# Patient Record
Sex: Female | Born: 1980 | Race: White | Hispanic: No | Marital: Married | Smoking: Former smoker
Health system: Southern US, Community
[De-identification: ages and names within clinical notes are randomized; demographics above are authoritative.]

## PROBLEM LIST (undated history)

## (undated) DIAGNOSIS — I10 Essential (primary) hypertension: Secondary | ICD-10-CM

## (undated) DIAGNOSIS — O139 Gestational [pregnancy-induced] hypertension without significant proteinuria, unspecified trimester: Secondary | ICD-10-CM

## (undated) DIAGNOSIS — H9190 Unspecified hearing loss, unspecified ear: Secondary | ICD-10-CM

## (undated) HISTORY — DX: Essential (primary) hypertension: I10

## (undated) HISTORY — DX: Gestational (pregnancy-induced) hypertension without significant proteinuria, unspecified trimester: O13.9

## (undated) HISTORY — PX: BIOPSY BREAST: PRO8

---
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
## 2000-10-28 ENCOUNTER — Encounter: Admission: RE | Admit: 2000-10-28 | Discharge: 2000-10-28 | Payer: Self-pay | Admitting: *Deleted

## 2002-08-01 ENCOUNTER — Emergency Department (HOSPITAL_COMMUNITY): Admission: EM | Admit: 2002-08-01 | Discharge: 2002-08-01 | Payer: Self-pay | Admitting: Emergency Medicine

## 2002-08-04 ENCOUNTER — Encounter: Payer: Self-pay | Admitting: Family Medicine

## 2002-08-04 ENCOUNTER — Inpatient Hospital Stay (HOSPITAL_COMMUNITY): Admission: AD | Admit: 2002-08-04 | Discharge: 2002-08-04 | Payer: Self-pay | Admitting: Family Medicine

## 2002-10-18 ENCOUNTER — Ambulatory Visit (HOSPITAL_COMMUNITY): Admission: RE | Admit: 2002-10-18 | Discharge: 2002-10-18 | Payer: Self-pay | Admitting: *Deleted

## 2002-10-31 ENCOUNTER — Inpatient Hospital Stay (HOSPITAL_COMMUNITY): Admission: AD | Admit: 2002-10-31 | Discharge: 2002-10-31 | Payer: Self-pay | Admitting: *Deleted

## 2003-01-31 ENCOUNTER — Ambulatory Visit (HOSPITAL_COMMUNITY): Admission: RE | Admit: 2003-01-31 | Discharge: 2003-01-31 | Payer: Self-pay | Admitting: *Deleted

## 2003-02-16 ENCOUNTER — Inpatient Hospital Stay (HOSPITAL_COMMUNITY): Admission: EM | Admit: 2003-02-16 | Discharge: 2003-02-17 | Payer: Self-pay | Admitting: *Deleted

## 2003-02-19 ENCOUNTER — Ambulatory Visit (HOSPITAL_COMMUNITY): Admission: RE | Admit: 2003-02-19 | Discharge: 2003-02-19 | Payer: Self-pay | Admitting: Obstetrics & Gynecology

## 2003-02-22 ENCOUNTER — Encounter: Admission: RE | Admit: 2003-02-22 | Discharge: 2003-02-22 | Payer: Self-pay | Admitting: *Deleted

## 2003-03-01 ENCOUNTER — Encounter: Admission: RE | Admit: 2003-03-01 | Discharge: 2003-03-01 | Payer: Self-pay | Admitting: *Deleted

## 2003-03-08 ENCOUNTER — Encounter: Admission: RE | Admit: 2003-03-08 | Discharge: 2003-03-08 | Payer: Self-pay | Admitting: *Deleted

## 2003-03-15 ENCOUNTER — Encounter: Admission: RE | Admit: 2003-03-15 | Discharge: 2003-03-15 | Payer: Self-pay | Admitting: *Deleted

## 2003-03-16 ENCOUNTER — Inpatient Hospital Stay (HOSPITAL_COMMUNITY): Admission: AD | Admit: 2003-03-16 | Discharge: 2003-03-20 | Payer: Self-pay | Admitting: *Deleted

## 2003-03-16 ENCOUNTER — Encounter: Admission: RE | Admit: 2003-03-16 | Discharge: 2003-03-16 | Payer: Self-pay | Admitting: *Deleted

## 2003-03-23 ENCOUNTER — Inpatient Hospital Stay (HOSPITAL_COMMUNITY): Admission: AD | Admit: 2003-03-23 | Discharge: 2003-03-23 | Payer: Self-pay | Admitting: Obstetrics and Gynecology

## 2003-03-26 ENCOUNTER — Inpatient Hospital Stay (HOSPITAL_COMMUNITY): Admission: AD | Admit: 2003-03-26 | Discharge: 2003-03-26 | Payer: Self-pay | Admitting: Obstetrics and Gynecology

## 2003-05-09 ENCOUNTER — Encounter: Admission: RE | Admit: 2003-05-09 | Discharge: 2003-05-09 | Payer: Self-pay | Admitting: General Surgery

## 2004-03-19 ENCOUNTER — Emergency Department (HOSPITAL_COMMUNITY): Admission: EM | Admit: 2004-03-19 | Discharge: 2004-03-19 | Payer: Self-pay | Admitting: Emergency Medicine

## 2004-07-15 ENCOUNTER — Emergency Department (HOSPITAL_COMMUNITY): Admission: EM | Admit: 2004-07-15 | Discharge: 2004-07-15 | Payer: Self-pay | Admitting: Family Medicine

## 2004-11-10 ENCOUNTER — Encounter: Admission: RE | Admit: 2004-11-10 | Discharge: 2004-11-10 | Payer: Self-pay | Admitting: Obstetrics and Gynecology

## 2004-12-17 ENCOUNTER — Ambulatory Visit (HOSPITAL_BASED_OUTPATIENT_CLINIC_OR_DEPARTMENT_OTHER): Admission: RE | Admit: 2004-12-17 | Discharge: 2004-12-17 | Payer: Self-pay | Admitting: General Surgery

## 2004-12-17 ENCOUNTER — Encounter (INDEPENDENT_AMBULATORY_CARE_PROVIDER_SITE_OTHER): Payer: Self-pay | Admitting: Specialist

## 2004-12-17 ENCOUNTER — Ambulatory Visit (HOSPITAL_COMMUNITY): Admission: RE | Admit: 2004-12-17 | Discharge: 2004-12-17 | Payer: Self-pay | Admitting: General Surgery

## 2005-03-31 ENCOUNTER — Encounter: Admission: RE | Admit: 2005-03-31 | Discharge: 2005-04-22 | Payer: Self-pay | Admitting: Sports Medicine

## 2006-09-14 ENCOUNTER — Encounter: Admission: RE | Admit: 2006-09-14 | Discharge: 2006-11-16 | Payer: Self-pay | Admitting: Orthopaedic Surgery

## 2006-12-07 ENCOUNTER — Encounter: Admission: RE | Admit: 2006-12-07 | Discharge: 2006-12-28 | Payer: Self-pay | Admitting: Orthopedic Surgery

## 2007-07-18 ENCOUNTER — Emergency Department (HOSPITAL_COMMUNITY): Admission: EM | Admit: 2007-07-18 | Discharge: 2007-07-18 | Payer: Self-pay | Admitting: Family Medicine

## 2008-09-05 ENCOUNTER — Emergency Department (HOSPITAL_COMMUNITY): Admission: EM | Admit: 2008-09-05 | Discharge: 2008-09-05 | Payer: Self-pay | Admitting: Emergency Medicine

## 2008-10-09 ENCOUNTER — Emergency Department (HOSPITAL_COMMUNITY): Admission: EM | Admit: 2008-10-09 | Discharge: 2008-10-09 | Payer: Self-pay | Admitting: Family Medicine

## 2009-02-24 ENCOUNTER — Emergency Department (HOSPITAL_COMMUNITY): Admission: EM | Admit: 2009-02-24 | Discharge: 2009-02-24 | Payer: Self-pay | Admitting: Family Medicine

## 2009-04-07 ENCOUNTER — Emergency Department (HOSPITAL_COMMUNITY): Admission: EM | Admit: 2009-04-07 | Discharge: 2009-04-07 | Payer: Self-pay | Admitting: Family Medicine

## 2009-10-12 ENCOUNTER — Emergency Department (HOSPITAL_COMMUNITY): Admission: EM | Admit: 2009-10-12 | Discharge: 2009-10-12 | Payer: Self-pay | Admitting: Emergency Medicine

## 2009-12-11 ENCOUNTER — Emergency Department (HOSPITAL_COMMUNITY): Admission: EM | Admit: 2009-12-11 | Discharge: 2009-12-11 | Payer: Self-pay | Admitting: Family Medicine

## 2010-07-16 ENCOUNTER — Ambulatory Visit (HOSPITAL_COMMUNITY)
Admission: RE | Admit: 2010-07-16 | Discharge: 2010-07-16 | Payer: Self-pay | Source: Home / Self Care | Attending: Psychology | Admitting: Psychology

## 2010-07-30 ENCOUNTER — Ambulatory Visit (HOSPITAL_COMMUNITY): Admit: 2010-07-30 | Payer: Self-pay | Admitting: Psychology

## 2010-11-21 NOTE — Op Note (Signed)
NAMESACHI, BOULAY               ACCOUNT NO.:  [id number]   MEDICAL RECORD NO.:  [id number]          PATIENT TYPE:  AMB   LOCATION:  DSC                          FACILITY:  [facility]   PHYSICIAN:  Rose Phi. Maple Hudson, M.D.   DATE OF BIRTH:  [birth year]   DATE OF PROCEDURE:  12/17/2004  DATE OF DISCHARGE:                                 OPERATIVE REPORT   PREOPERATIVE DIAGNOSIS:  Fibroadenoma of the right breast.   POSTOPERATIVE DIAGNOSIS:  Fibroadenoma of the right breast.   OPERATIONS:  Excision of right breast mass.   SURGEON:  Dr. Francina Ames.   ANESTHESIA:  MAC.   OPERATIVE PROCEDURE:  The patient was placed on the operating table with the  arms extended and the right breast prepped and draped in the usual fashion.  The palpable nodules at about the 5:30 position and subareolar. A short  circumareolar incision was then outlined and their area infiltrated with the  local anesthetic mixture. Incision was made and the fibroadenoma exposed. I  grasped it and elevated it and then excised it. Two small bleeders were  coagulated.   Skin closure was subcuticular 4-0 Monocryl and Steri-Strips.   Dressing was applied and the patient transferred to the recovery room in  satisfactory condition having tolerated the procedure well.       PRY/MEDQ  D:  12/17/2004  T:  12/17/2004  Job:  [id number]

## 2012-09-21 ENCOUNTER — Encounter (HOSPITAL_COMMUNITY): Payer: Self-pay

## 2012-09-21 ENCOUNTER — Inpatient Hospital Stay (HOSPITAL_COMMUNITY): Payer: Medicaid Other

## 2012-09-21 ENCOUNTER — Inpatient Hospital Stay (HOSPITAL_COMMUNITY)
Admission: AD | Admit: 2012-09-21 | Discharge: 2012-09-21 | Disposition: A | Discharge status: Home / Self Care | Payer: Medicaid Other | Source: Ambulatory Visit | Attending: Family Medicine | Admitting: Family Medicine

## 2012-09-21 DIAGNOSIS — O039 Complete or unspecified spontaneous abortion without complication: Secondary | ICD-10-CM | POA: Insufficient documentation

## 2012-09-21 DIAGNOSIS — O200 Threatened abortion: Secondary | ICD-10-CM

## 2012-09-21 DIAGNOSIS — R109 Unspecified abdominal pain: Secondary | ICD-10-CM | POA: Insufficient documentation

## 2012-09-21 HISTORY — DX: Unspecified hearing loss, unspecified ear: H91.90

## 2012-09-21 LAB — URINALYSIS, ROUTINE W REFLEX MICROSCOPIC
Leukocytes, UA: NEGATIVE
Nitrite: NEGATIVE
Protein, ur: NEGATIVE mg/dL
Specific Gravity, Urine: >1.03 — ABNORMAL HIGH (ref 1.005–1.030)
Urobilinogen, UA: 0.2 mg/dL (ref 0.0–1.0)

## 2012-09-21 LAB — WET PREP, GENITAL: Trich, Wet Prep: NONE SEEN

## 2012-09-21 LAB — CBC
MCH: 29.8 pg (ref 26.0–34.0)
MCHC: 33.4 g/dL (ref 30.0–36.0)
Platelets: 199 10*3/uL (ref 150–400)
RDW: 13.6 % (ref 11.5–15.5)

## 2012-09-21 LAB — ABO/RH: ABO/RH(D): O POS

## 2012-09-21 LAB — URINE MICROSCOPIC-ADD ON

## 2012-09-21 LAB — HCG, QUANTITATIVE, PREGNANCY: hCG, Beta Chain, Quant, S: 2465 m[IU]/mL — ABNORMAL HIGH (ref <5)

## 2012-09-21 LAB — POCT PREGNANCY, URINE: Preg Test, Ur: POSITIVE — AB

## 2012-09-21 NOTE — MAU Provider Note (Signed)
Chart reviewed and agree with management and plan.  

## 2012-09-21 NOTE — MAU Note (Signed)
Patient states she started bleeding this am with a gush of blood. Missed last months period. Having abdominal pain.

## 2012-09-21 NOTE — MAU Provider Note (Signed)
History     CSN: [id number]  Arrival date and time: 09/21/12 5784   First Provider Initiated Contact with Patient 09/21/12 [phone]      Chief Complaint  Patient presents with  . Vaginal Bleeding  . Abdominal Pain   HPI Alicia Ortiz 32 y.o. Comes to MAU with heavy vaginal bleeding and abdominal cramping.  Last period was in January.  No period in February.  Began having vaginal bleeding on Monday and thought it was her period.  Yesterday had cramping which kept her from sleeping well last night.  Earlier this morning she had a large amount of bleeding with clots so came for evaluation.  Urine pregnancy test is positive.  Uses no contraception.  OB History   Grav Para Term Preterm Abortions TAB SAB Ect Mult Living   1 1        1       Past Medical History  Diagnosis Date  . Deaf     earing aids, reads lips    Past Surgical History  Procedure Laterality Date  . Biopsy breast      right breast    Family History  Problem Relation Age of Onset  . Stroke Mother     History  Substance Use Topics  . Smoking status: Light Tobacco Smoker  . Smokeless tobacco: Not on file  . Alcohol Use: No    Allergies:  Allergies  Allergen Reactions  . Sulfa Antibiotics Hives    Prescriptions prior to admission  Medication Sig Dispense Refill  . calcium carbonate-magnesium hydroxide (ROLAIDS) 334 MG CHEW Chew 2 tablets by mouth once.        ROS Physical Exam   Blood pressure 120/80, pulse 102, temperature 98.5 F (36.9 C), temperature source Oral, resp. rate 16, last menstrual period 09/21/2012.  Physical Exam  Constitutional: She is oriented to person, place, and time. She appears well-developed and well-nourished. No distress.  Cardiovascular: Normal rate and regular rhythm.   Respiratory: Effort normal and breath sounds normal. No respiratory distress.  GI: Soft. She exhibits no distension. There is no tenderness.  Genitourinary: Cervix exhibits no motion tenderness.  There is bleeding around the vagina. Vaginal discharge found.  Cervix is visually 1cm dilated with os filled by large blood clot  Neurological: She is alert and oriented to person, place, and time.  Skin: Skin is warm and dry.  Psychiatric: She has a normal mood and affect. Her behavior is normal.   Results for orders placed during the hospital encounter of 09/21/12 (from the past 24 hour(s))  URINALYSIS, ROUTINE W REFLEX MICROSCOPIC     Status: Abnormal   Collection Time    09/21/12  8:05 AM      Result Value Range   Color, Urine YELLOW  YELLOW   APPearance CLOUDY (*) CLEAR   Specific Gravity, Urine >1.030 (*) 1.005 - 1.030   pH 6.0  5.0 - 8.0   Glucose, UA NEGATIVE  NEGATIVE mg/dL   Hgb urine dipstick LARGE (*) NEGATIVE   Bilirubin Urine NEGATIVE  NEGATIVE   Ketones, ur NEGATIVE  NEGATIVE mg/dL   Protein, ur NEGATIVE  NEGATIVE mg/dL   Urobilinogen, UA 0.2  0.0 - 1.0 mg/dL   Nitrite NEGATIVE  NEGATIVE   Leukocytes, UA NEGATIVE  NEGATIVE  URINE MICROSCOPIC-ADD ON     Status: Abnormal   Collection Time    09/21/12  8:05 AM      Result Value Range   Squamous Epithelial / LPF FEW (*)  RARE   RBC / HPF TOO NUMEROUS TO COUNT  <3 RBC/hpf   Bacteria, UA RARE  RARE  POCT PREGNANCY, URINE     Status: Abnormal   Collection Time    09/21/12  8:36 AM      Result Value Range   Preg Test, Ur POSITIVE (*) NEGATIVE  HCG, QUANTITATIVE, PREGNANCY     Status: Abnormal   Collection Time    09/21/12  8:55 AM      Result Value Range   hCG, Beta Chain, Quant, S 2465 (*) <5 mIU/mL  CBC     Status: Abnormal   Collection Time    09/21/12  8:55 AM      Result Value Range   WBC 17.4 (*) 4.0 - 10.5 K/uL   RBC 3.93  3.87 - 5.11 MIL/uL   Hemoglobin 11.7 (*) 12.0 - 15.0 g/dL   HCT 16.1 (*) 09.6 - 04.5 %   MCV 89.1  78.0 - 100.0 fL   MCH 29.8  26.0 - 34.0 pg   MCHC 33.4  30.0 - 36.0 g/dL   RDW 40.9  81.1 - 91.4 %   Platelets 199  150 - 400 K/uL  ABO/RH     Status: None   Collection Time     09/21/12  8:55 AM      Result Value Range   ABO/RH(D) O POS    WET PREP, GENITAL     Status: Abnormal   Collection Time    09/21/12  9:29 AM      Result Value Range   Yeast Wet Prep HPF POC NONE SEEN  NONE SEEN   Trich, Wet Prep NONE SEEN  NONE SEEN   Clue Cells Wet Prep HPF POC NONE SEEN  NONE SEEN   WBC, Wet Prep HPF POC FEW (*) NONE SEEN     MAU Course  Procedures CBC, quant HCG, type and Rh done, ultrasound, sterile spec exam with cultures for gc/ct, wet prep. Cervix visually dilated and large clot seen at os.    Assessment and Plan  1. G2P1001 at 8.2 weeks with heavy vaginal bleeding, GA by u/s 4.4 weeks 2. SAB vs early pregnancy bleeding  -Return to MAU Friday morning for repeat quant HCG -Tylenol for pain -Instructed to return for severe pain or increased bleeding I have seen this patient with the student Nurse Midwife, Leandro Reasoner.  I agree with the exam and documentation. Nolene Bernheim, RN, FNP  Nolene Bernheim 09/21/2012, 8:44 AM

## 2012-09-22 LAB — GC/CHLAMYDIA PROBE AMP: CT Probe RNA: NEGATIVE

## 2012-09-23 ENCOUNTER — Inpatient Hospital Stay (HOSPITAL_COMMUNITY)
Admission: AD | Admit: 2012-09-23 | Discharge: 2012-09-23 | Disposition: A | Discharge status: Home / Self Care | Payer: Medicaid Other | Source: Ambulatory Visit | Attending: Obstetrics & Gynecology | Admitting: Obstetrics & Gynecology

## 2012-09-23 DIAGNOSIS — O039 Complete or unspecified spontaneous abortion without complication: Secondary | ICD-10-CM | POA: Insufficient documentation

## 2012-09-23 LAB — HCG, QUANTITATIVE, PREGNANCY: hCG, Beta Chain, Quant, S: 527 m[IU]/mL — ABNORMAL HIGH (ref <5)

## 2012-09-23 NOTE — MAU Note (Signed)
Patient to MAU for repeat BHCG. Patient states she continues to have bleeding like a period but no pain.

## 2012-09-23 NOTE — MAU Provider Note (Signed)
CC: Follow-up      HPI Alicia Ortiz is a 32 y.o. G2P1 [redacted]w[redacted]d by LMP presenting for a followup quantitative beta-hCG. She was initially seen 09/21/2012 after having heavy vaginal bleeding and cramping with passage of clots. Quantitative beta hCG was 2465. Her bleeding has tapered to light and she is not having abdominal pain now.   Past Medical History  Diagnosis Date  . Deaf     earing aids, reads lips    OB History   Grav Para Term Preterm Abortions TAB SAB Ect Mult Living   2 1        1      # Outc Date GA Lbr Len/2nd Wgt Sex Del Anes PTL Lv   1 CUR            2 PAR               Past Surgical History  Procedure Laterality Date  . Biopsy breast      right breast    History   Social History  . Marital Status: Single    Spouse Name: N/A    Number of Children: N/A  . Years of Education: N/A   Occupational History  . Not on file.   Social History Main Topics  . Smoking status: Light Tobacco Smoker  . Smokeless tobacco: Not on file  . Alcohol Use: No  . Drug Use: No  . Sexually Active: Yes   Other Topics Concern  . Not on file   Social History Narrative  . No narrative on file    No current facility-administered medications on file prior to encounter.   Current Outpatient Prescriptions on File Prior to Encounter  Medication Sig Dispense Refill  . calcium carbonate-magnesium hydroxide (ROLAIDS) 334 MG CHEW Chew 2 tablets by mouth once.        Allergies  Allergen Reactions  . Sulfa Antibiotics Hives    ROS Pertinent items in HPI  PHYSICAL EXAM Filed Vitals:   09/23/12 0914  BP: 133/93  Pulse: 99  Temp: 98 F (36.7 C)  Resp: 16   General: Well nourished, well developed female in no acute distress Cardiovascular: Normal rate Respiratory: Normal effort Abdomen: Soft, nontender Back: No CVAT Extremities: No edema Neurologic: Alert and oriented   LAB RESULTS Results for orders placed during the hospital encounter of 09/23/12 (from the  past 24 hour(s))  HCG, QUANTITATIVE, PREGNANCY     Status: Abnormal   Collection Time    09/23/12  8:48 AM      Result Value Range   hCG, Beta Chain, Quant, S 527 (*) <5 mIU/mL    IMAGING 09/21/12 [place]: question IUGS 5 wk size, small left CLC, otherwise normal    ASSESSMENT  1. Complete miscarriage   Most likely has completed SAB  PLAN Discharge home with ectopic precautionsSee AVS for patient education. Follow up quantitative beta-hCG in one week Follow-up Information   Follow up with [facility] In 1 week. (someone from the clinic will call you with an appointment for your blood tests in one week and for Pap smear appointment)        Medication List    TAKE these medications       calcium carbonate-magnesium hydroxide 334 MG Chew  Commonly known as:  ROLAIDS  Chew 2 tablets by mouth once.          Danae Orleans, CNM 09/23/2012 10:56 AM

## 2012-09-30 ENCOUNTER — Other Ambulatory Visit: Payer: Medicaid Other

## 2012-09-30 DIAGNOSIS — O039 Complete or unspecified spontaneous abortion without complication: Secondary | ICD-10-CM

## 2012-09-30 LAB — HCG, QUANTITATIVE, PREGNANCY: hCG, Beta Chain, Quant, S: 38.6 m[IU]/mL

## 2012-10-12 ENCOUNTER — Encounter: Payer: Self-pay | Admitting: Advanced Practice Midwife

## 2012-10-12 ENCOUNTER — Other Ambulatory Visit (HOSPITAL_COMMUNITY)
Admission: RE | Admit: 2012-10-12 | Discharge: 2012-10-12 | Disposition: A | Discharge status: Home / Self Care | Payer: Medicaid Other | Source: Ambulatory Visit | Attending: Obstetrics & Gynecology | Admitting: Obstetrics & Gynecology

## 2012-10-12 ENCOUNTER — Ambulatory Visit (INDEPENDENT_AMBULATORY_CARE_PROVIDER_SITE_OTHER): Payer: Medicaid Other | Admitting: Advanced Practice Midwife

## 2012-10-12 VITALS — BP 136/90 | HR 91 | Temp 97.4°F | Ht 62.25 in | Wt 146.3 lb

## 2012-10-12 DIAGNOSIS — O039 Complete or unspecified spontaneous abortion without complication: Secondary | ICD-10-CM

## 2012-10-12 DIAGNOSIS — Z1151 Encounter for screening for human papillomavirus (HPV): Secondary | ICD-10-CM | POA: Insufficient documentation

## 2012-10-12 DIAGNOSIS — Z01419 Encounter for gynecological examination (general) (routine) without abnormal findings: Secondary | ICD-10-CM | POA: Insufficient documentation

## 2012-10-12 DIAGNOSIS — Z124 Encounter for screening for malignant neoplasm of cervix: Secondary | ICD-10-CM

## 2012-10-12 LAB — POCT PREGNANCY, URINE: Preg Test, Ur: NEGATIVE

## 2012-10-12 NOTE — Progress Notes (Signed)
Subjective:     Alicia Ortiz is a 32 y.o. female here for pap and f/u from MAU after SAB.  Current complaints: none.  Quant was 38 two weeks ago.  Gynecologic History Patient's last menstrual period was 07/25/2012. Contraception: none Last Pap: unsure. Results were: normal   Obstetric History OB History   Grav Para Term Preterm Abortions TAB SAB Ect Mult Living   2 1        1      # Outc Date GA Lbr Len/2nd Wgt Sex Del Anes PTL Lv   1 GRA            Comments: System Generated. Please review and update pregnancy details.   2 PAR                The following portions of the patient's history were reviewed and updated as appropriate: allergies, current medications, past family history, past medical history, past social history, past surgical history and problem list.  Review of Systems A comprehensive review of systems was negative.    Objective:    BP 136/90  Pulse 91  Temp(Src) 97.4 F (36.3 C) (Oral)  Ht 5' 2.25" (1.581 m)  Wt 146 lb 4.8 oz (66.361 kg)  BMI 26.55 kg/m2  LMP 07/25/2012  Breastfeeding? Unknown  General Appearance:    Alert, cooperative, no distress, appears stated age  Head:    Normocephalic, without obvious abnormality, atraumatic  Neck:   Supple, symmetrical, trachea midline, no adenopathy;    thyroid:  no enlargement/tenderness/nodules; no carotid   bruit or JVD  Back:     Symmetric, no curvature, ROM normal, no CVA tenderness  Lungs:     Normal rate, no distress  Chest Wall:    No tenderness or deformity   Heart:    Regular rate   Breast Exam:    No tenderness, masses, or nipple abnormality  Abdomen:     Soft, non-tender, bowel sounds active all four quadrants,    no masses, no organomegaly  Genitalia:    Normal female without lesion, discharge or tenderness     Extremities:   Extremities normal, atraumatic, no cyanosis or edema  Pulses:   2+ and symmetric all extremities  Skin:   Skin color, texture, turgor normal, no rashes or lesions   Lymph nodes:   Cervical, supraclavicular, and axillary nodes normal  Neurologic:   CNII-XII intact, normal strength, sensation and reflexes    throughout      Assessment:    Healthy female exam.   UPT negative today - complete SAB Plan:    F/U annually or PRN Pap with HPV done today

## 2013-02-22 ENCOUNTER — Encounter: Payer: Self-pay | Admitting: Obstetrics & Gynecology

## 2013-02-22 ENCOUNTER — Ambulatory Visit: Payer: Medicaid Other

## 2013-03-28 ENCOUNTER — Encounter: Payer: Self-pay | Admitting: Obstetrics & Gynecology

## 2013-03-28 ENCOUNTER — Ambulatory Visit (INDEPENDENT_AMBULATORY_CARE_PROVIDER_SITE_OTHER): Payer: Medicaid Other | Admitting: Obstetrics & Gynecology

## 2013-03-28 VITALS — BP 119/79 | Temp 97.7°F | Wt 153.0 lb

## 2013-03-28 DIAGNOSIS — H9190 Unspecified hearing loss, unspecified ear: Secondary | ICD-10-CM | POA: Insufficient documentation

## 2013-03-28 DIAGNOSIS — Z3480 Encounter for supervision of other normal pregnancy, unspecified trimester: Secondary | ICD-10-CM

## 2013-03-28 DIAGNOSIS — Z3491 Encounter for supervision of normal pregnancy, unspecified, first trimester: Secondary | ICD-10-CM

## 2013-03-28 DIAGNOSIS — O0993 Supervision of high risk pregnancy, unspecified, third trimester: Secondary | ICD-10-CM | POA: Insufficient documentation

## 2013-03-28 LAB — POCT URINALYSIS DIP (DEVICE)
Glucose, UA: NEGATIVE mg/dL
Hgb urine dipstick: NEGATIVE
Leukocytes, UA: NEGATIVE
Nitrite: NEGATIVE
Urobilinogen, UA: 1 mg/dL (ref 0.0–1.0)
pH: 7 (ref 5.0–8.0)

## 2013-03-28 LAB — OB RESULTS CONSOLE HGB/HCT, BLOOD
HEMATOCRIT: 35 %
HEMOGLOBIN: 12 g/dL

## 2013-03-28 LAB — OB RESULTS CONSOLE RPR: RPR: NONREACTIVE

## 2013-03-28 LAB — OB RESULTS CONSOLE PLATELET COUNT: Platelets: 203 10*3/uL

## 2013-03-28 MED ORDER — PRENATAL MULTIVITAMIN CH: 1.0000 | ORAL_TABLET | Freq: Every day | ORAL | Status: DC

## 2013-03-28 NOTE — Progress Notes (Signed)
Pulse- 91 Patient reports lower abdominal/pelvic pain

## 2013-03-28 NOTE — Progress Notes (Signed)
   Subjective:    Alicia Ortiz is a [id number] [redacted]w[redacted]d being seen today for her first obstetrical visit.  Her obstetrical history is significant for miscarriage earlier this year. Patient does intend to breast feed. Pregnancy history fully reviewed.  Patient reports no complaints.  Filed Vitals:   03/28/13 0914  BP: 119/79  Temp: 97.7 F (36.5 C)  Weight: 153 lb (69.4 kg)    HISTORY: OB History  Gravida Para Term Preterm AB SAB TAB Ectopic Multiple Living  3 1 1  0 1 1 0 0 0 1    # Outcome Date GA Lbr Len/2nd Weight Sex Delivery Anes PTL Lv  3 CUR           2 SAB 09/2012          1 TRM 03/18/03 [redacted]w[redacted]d  7 lb 9 oz (3.43 kg) F SVD None  Y     Comments: high BP in pregnancy, was put on bedrest      Past Medical History  Diagnosis Date  . Deaf     earing aids, reads lips   Past Surgical History  Procedure Laterality Date  . Biopsy breast      right breast   Family History  Problem Relation Age of Onset  . Stroke Mother   . Hypertension Mother   . Hyperlipidemia Mother   . Cancer Daughter      Exam    Uterus:     Pelvic Exam:    Perineum: No Hemorrhoids   Vulva: normal   Vagina:  normal mucosa, normal discharge   pH: n/a   Cervix: small mobile endocervical polyp, not bleeding   Adnexa: normal adnexa   Bony Pelvis: average  System: Breast:  normal appearance, no masses or tenderness   Skin: normal coloration and turgor, no rashes    Neurologic: oriented, normal mood   Extremities: no erythema, induration, or nodules   HEENT sclera clear, anicteric, oropharynx clear, no lesions, neck supple with midline trachea and thyroid without masses   Mouth/Teeth mucous membranes moist, pharynx normal without lesions and dental hygiene good   Neck supple and no masses   Cardiovascular: regular rate and rhythm   Respiratory:  appears well, vitals normal, no respiratory distress, acyanotic, normal RR, chest clear, no wheezing, crepitations, rhonchi, normal symmetric air  entry   Abdomen: soft, non-tender; bowel sounds normal; no masses,  no organomegaly   Urinary: urethral meatus normal      Assessment:    Pregnancy: [id number] Patient Active Problem List   Diagnosis Date Noted  . Deaf 03/28/2013        Plan:     Initial labs drawn. Prenatal vitamins. Problem list reviewed and updated. Genetic Screening discussed First Screen: ordered.  Ultrasound discussed; fetal survey: ordered.--needs dating since unsure of LMP.  Had spotting in July, but last nml menses was June.  Had miscarriage in March 2014.  Follow up in 4 weeks. Prenatal vitamins. Pap earlier this year is WNL--negative HPV.  Nakeshia Waldeck H. 03/28/2013

## 2013-03-28 NOTE — Patient Instructions (Signed)
Pregnancy - First Trimester  During sexual intercourse, millions of sperm go into the vagina. Only 1 sperm will penetrate and fertilize the female egg while it is in the Fallopian tube. One week later, the fertilized egg implants into the wall of the uterus. An embryo begins to develop into a baby. At 6 to 8 weeks, the eyes and face are formed and the heartbeat can be seen on ultrasound. At the end of 12 weeks (first trimester), all the baby's organs are formed. Now that you are pregnant, you will want to do everything you can to have a healthy baby. Two of the most important things are to get good prenatal care and follow your caregiver's instructions. Prenatal care is all the medical care you receive before the baby's birth. It is given to prevent, find, and treat problems during the pregnancy and childbirth.  PRENATAL EXAMS  · During prenatal visits, your weight, blood pressure, and urine are checked. This is done to make sure you are healthy and progressing normally during the pregnancy.  · A pregnant woman should gain 25 to 35 pounds during the pregnancy. However, if you are overweight or underweight, your caregiver will advise you regarding your weight.  · Your caregiver will ask and answer questions for you.  · Blood work, cervical cultures, other necessary tests, and a Pap test are done during your prenatal exams. These tests are done to check on your health and the probable health of your baby. Tests are strongly recommended and done for HIV with your permission. This is the virus that causes AIDS. These tests are done because medicines can be given to help prevent your baby from being born with this infection should you have been infected without knowing it. Blood work is also used to find out your blood type, previous infections, and follow your blood levels (hemoglobin).  · Low hemoglobin (anemia) is common during pregnancy. Iron and vitamins are given to help prevent this. Later in the pregnancy, blood  tests for diabetes will be done along with any other tests if any problems develop.  · You may need other tests to make sure you and the baby are doing well.  CHANGES DURING THE FIRST TRIMESTER   Your body goes through many changes during pregnancy. They vary from person to person. Talk to your caregiver about changes you notice and are concerned about. Changes can include:  · Your menstrual period stops.  · The egg and sperm carry the genes that determine what you look like. Genes from you and your partner are forming a baby. The female genes determine whether the baby is a boy or a girl.  · Your body increases in girth and you may feel bloated.  · Feeling sick to your stomach (nauseous) and throwing up (vomiting). If the vomiting is uncontrollable, call your caregiver.  · Your breasts will begin to enlarge and become tender.  · Your nipples may stick out more and become darker.  · The need to urinate more. Painful urination may mean you have a bladder infection.  · Tiring easily.  · Loss of appetite.  · Cravings for certain kinds of food.  · At first, you may gain or lose a couple of pounds.  · You may have changes in your emotions from day to day (excited to be pregnant or concerned something may go wrong with the pregnancy and baby).  · You may have more vivid and strange dreams.  HOME CARE INSTRUCTIONS   ·   It is very important to avoid all smoking, alcohol and non-prescribed drugs during your pregnancy. These affect the formation and growth of the baby. Avoid chemicals while pregnant to ensure the delivery of a healthy infant.  · Start your prenatal visits by the 12th week of pregnancy. They are usually scheduled monthly at first, then more often in the last 2 months before delivery. Keep your caregiver's appointments. Follow your caregiver's instructions regarding medicine use, blood and lab tests, exercise, and diet.  · During pregnancy, you are providing food for you and your baby. Eat regular, well-balanced  meals. Choose foods such as meat, fish, milk and other low fat dairy products, vegetables, fruits, and whole-grain breads and cereals. Your caregiver will tell you of the ideal weight gain.  · You can help morning sickness by keeping soda crackers at the bedside. Eat a couple before arising in the morning. You may want to use the crackers without salt on them.  · Eating 4 to 5 small meals rather than 3 large meals a day also may help the nausea and vomiting.  · Drinking liquids between meals instead of during meals also seems to help nausea and vomiting.  · A physical sexual relationship may be continued throughout pregnancy if there are no other problems. Problems may be early (premature) leaking of amniotic fluid from the membranes, vaginal bleeding, or belly (abdominal) pain.  · Exercise regularly if there are no restrictions. Check with your caregiver or physical therapist if you are unsure of the safety of some of your exercises. Greater weight gain will occur in the last 2 trimesters of pregnancy. Exercising will help:  · Control your weight.  · Keep you in shape.  · Prepare you for labor and delivery.  · Help you lose your pregnancy weight after you deliver your baby.  · Wear a good support or jogging bra for breast tenderness during pregnancy. This may help if worn during sleep too.  · Ask when prenatal classes are available. Begin classes when they are offered.  · Do not use hot tubs, steam rooms, or saunas.  · Wear your seat belt when driving. This protects you and your baby if you are in an accident.  · Avoid raw meat, uncooked cheese, cat litter boxes, and soil used by cats throughout the pregnancy. These carry germs that can cause birth defects in the baby.  · The first trimester is a good time to visit your dentist for your dental health. Getting your teeth cleaned is okay. Use a softer toothbrush and brush gently during pregnancy.  · Ask for help if you have financial, counseling, or nutritional needs  during pregnancy. Your caregiver will be able to offer counseling for these needs as well as refer you for other special needs.  · Do not take any medicines or herbs unless told by your caregiver.  · Inform your caregiver if there is any mental or physical domestic violence.  · Make a list of emergency phone numbers of family, friends, hospital, and police and fire departments.  · Write down your questions. Take them to your prenatal visit.  · Do not douche.  · Do not cross your legs.  · If you have to stand for long periods of time, rotate you feet or take small steps in a circle.  · You may have more vaginal secretions that may require a sanitary pad. Do not use tampons or scented sanitary pads.  MEDICINES AND DRUG USE IN PREGNANCY  ·   Take prenatal vitamins as directed. The vitamin should contain 1 milligram of folic acid. Keep all vitamins out of reach of children. Only a couple vitamins or tablets containing iron may be fatal to a baby or young child when ingested.  · Avoid use of all medicines, including herbs, over-the-counter medicines, not prescribed or suggested by your caregiver. Only take over-the-counter or prescription medicines for pain, discomfort, or fever as directed by your caregiver. Do not use aspirin, ibuprofen, or naproxen unless directed by your caregiver.  · Let your caregiver also know about herbs you may be using.  · Alcohol is related to a number of birth defects. This includes fetal alcohol syndrome. All alcohol, in any form, should be avoided completely. Smoking will cause low birth rate and premature babies.  · Street or illegal drugs are very harmful to the baby. They are absolutely forbidden. A baby born to an addicted mother will be addicted at birth. The baby will go through the same withdrawal an adult does.  · Let your caregiver know about any medicines that you have to take and for what reason you take them.  SEEK MEDICAL CARE IF:   You have any concerns or worries during your  pregnancy. It is better to call with your questions if you feel they cannot wait, rather than worry about them.  SEEK IMMEDIATE MEDICAL CARE IF:   · An unexplained oral temperature above 102° F (38.9° C) develops, or as your caregiver suggests.  · You have leaking of fluid from the vagina (birth canal). If leaking membranes are suspected, take your temperature and inform your caregiver of this when you call.  · There is vaginal spotting or bleeding. Notify your caregiver of the amount and how many pads are used.  · You develop a bad smelling vaginal discharge with a change in the color.  · You continue to feel sick to your stomach (nauseated) and have no relief from remedies suggested. You vomit blood or coffee ground-like materials.  · You lose more than 2 pounds of weight in 1 week.  · You gain more than 2 pounds of weight in 1 week and you notice swelling of your face, hands, feet, or legs.  · You gain 5 pounds or more in 1 week (even if you do not have swelling of your hands, face, legs, or feet).  · You get exposed to German measles and have never had them.  · You are exposed to fifth disease or chickenpox.  · You develop belly (abdominal) pain. Round ligament discomfort is a common non-cancerous (benign) cause of abdominal pain in pregnancy. Your caregiver still must evaluate this.  · You develop headache, fever, diarrhea, pain with urination, or shortness of breath.  · You fall or are in a car accident or have any kind of trauma.  · There is mental or physical violence in your home.  Document Released: 06/16/2001 Document Revised: 03/16/2012 Document Reviewed: 12/18/2008  [employer]® Patient Information ©2014 [employer].

## 2013-03-29 LAB — OBSTETRIC PANEL
Antibody Screen: NEGATIVE
Basophils Absolute: 0 10*3/uL (ref 0.0–0.1)
Basophils Relative: 0 % (ref 0–1)
Eosinophils Absolute: 0.1 10*3/uL (ref 0.0–0.7)
Hemoglobin: 12 g/dL (ref 12.0–15.0)
Hepatitis B Surface Ag: NEGATIVE
MCH: 29.1 pg (ref 26.0–34.0)
MCHC: 34.2 g/dL (ref 30.0–36.0)
Monocytes Relative: 7 % (ref 3–12)
Neutro Abs: 8.2 10*3/uL — ABNORMAL HIGH (ref 1.7–7.7)
Neutrophils Relative %: 75 % (ref 43–77)
RDW: 17 % — ABNORMAL HIGH (ref 11.5–15.5)
Rh Type: POSITIVE

## 2013-03-29 LAB — CULTURE, OB URINE
Colony Count: NO GROWTH
Organism ID, Bacteria: NO GROWTH

## 2013-03-29 LAB — GC/CHLAMYDIA PROBE AMP: CT Probe RNA: NEGATIVE

## 2013-04-04 ENCOUNTER — Ambulatory Visit (HOSPITAL_COMMUNITY): Admission: RE | Admit: 2013-04-04 | Payer: Medicaid Other | Source: Ambulatory Visit

## 2013-04-04 ENCOUNTER — Other Ambulatory Visit: Payer: Self-pay | Admitting: Obstetrics & Gynecology

## 2013-04-04 ENCOUNTER — Ambulatory Visit (HOSPITAL_COMMUNITY)
Admission: RE | Admit: 2013-04-04 | Discharge: 2013-04-04 | Disposition: A | Discharge status: Home / Self Care | Payer: Medicaid Other | Source: Ambulatory Visit | Attending: Obstetrics & Gynecology | Admitting: Obstetrics & Gynecology

## 2013-04-04 DIAGNOSIS — Z363 Encounter for antenatal screening for malformations: Secondary | ICD-10-CM | POA: Insufficient documentation

## 2013-04-04 DIAGNOSIS — Z3687 Encounter for antenatal screening for uncertain dates: Secondary | ICD-10-CM

## 2013-04-04 DIAGNOSIS — O358XX0 Maternal care for other (suspected) fetal abnormality and damage, not applicable or unspecified: Secondary | ICD-10-CM | POA: Insufficient documentation

## 2013-04-04 DIAGNOSIS — Z1389 Encounter for screening for other disorder: Secondary | ICD-10-CM | POA: Insufficient documentation

## 2013-04-25 ENCOUNTER — Encounter: Payer: Self-pay | Admitting: Obstetrics & Gynecology

## 2013-04-25 ENCOUNTER — Ambulatory Visit (INDEPENDENT_AMBULATORY_CARE_PROVIDER_SITE_OTHER): Payer: Medicaid Other | Admitting: Obstetrics & Gynecology

## 2013-04-25 VITALS — BP 126/81 | Wt 160.3 lb

## 2013-04-25 DIAGNOSIS — Z23 Encounter for immunization: Secondary | ICD-10-CM

## 2013-04-25 DIAGNOSIS — Z3492 Encounter for supervision of normal pregnancy, unspecified, second trimester: Secondary | ICD-10-CM

## 2013-04-25 DIAGNOSIS — Z3480 Encounter for supervision of other normal pregnancy, unspecified trimester: Secondary | ICD-10-CM

## 2013-04-25 LAB — POCT URINALYSIS DIP (DEVICE)
Bilirubin Urine: NEGATIVE
Glucose, UA: NEGATIVE mg/dL
Ketones, ur: NEGATIVE mg/dL
Leukocytes, UA: NEGATIVE
Nitrite: NEGATIVE
Specific Gravity, Urine: 1.01 (ref 1.005–1.030)
pH: 7.5 (ref 5.0–8.0)

## 2013-04-25 NOTE — Patient Instructions (Signed)
Return to clinic for any obstetric concerns or go to [facility]U for evaluation  

## 2013-04-25 NOTE — Progress Notes (Signed)
Flu vaccine today. Declines quad screen.  OB detailed scan ordered for 22 weeks on the same day as next visit.  No other complaints or concerns.  Routine obstetric precautions reviewed.

## 2013-04-25 NOTE — Progress Notes (Signed)
Pulse:81 Would like flu shot.

## 2013-05-03 ENCOUNTER — Encounter: Payer: Self-pay | Admitting: *Deleted

## 2013-05-23 ENCOUNTER — Encounter: Payer: Self-pay | Admitting: Obstetrics and Gynecology

## 2013-05-23 ENCOUNTER — Other Ambulatory Visit: Payer: Self-pay | Admitting: Obstetrics & Gynecology

## 2013-05-23 ENCOUNTER — Ambulatory Visit (INDEPENDENT_AMBULATORY_CARE_PROVIDER_SITE_OTHER): Payer: Medicaid Other | Admitting: Obstetrics and Gynecology

## 2013-05-23 ENCOUNTER — Ambulatory Visit (HOSPITAL_COMMUNITY)
Admission: RE | Admit: 2013-05-23 | Discharge: 2013-05-23 | Disposition: A | Discharge status: Home / Self Care | Payer: Medicaid Other | Source: Ambulatory Visit | Attending: Obstetrics & Gynecology | Admitting: Obstetrics & Gynecology

## 2013-05-23 VITALS — BP 125/77 | Temp 97.1°F | Wt 164.8 lb

## 2013-05-23 DIAGNOSIS — Z3492 Encounter for supervision of normal pregnancy, unspecified, second trimester: Secondary | ICD-10-CM

## 2013-05-23 DIAGNOSIS — Z3480 Encounter for supervision of other normal pregnancy, unspecified trimester: Secondary | ICD-10-CM

## 2013-05-23 DIAGNOSIS — Z3689 Encounter for other specified antenatal screening: Secondary | ICD-10-CM | POA: Insufficient documentation

## 2013-05-23 LAB — POCT URINALYSIS DIP (DEVICE)
Bilirubin Urine: NEGATIVE
Glucose, UA: NEGATIVE mg/dL
Ketones, ur: NEGATIVE mg/dL
Leukocytes, UA: NEGATIVE
Specific Gravity, Urine: 1.015 (ref 1.005–1.030)

## 2013-05-23 NOTE — Progress Notes (Signed)
P= 80 

## 2013-05-23 NOTE — Progress Notes (Signed)
Patient doing well without complaints. Patient with anatomy ultrasound today- normal per patient, report not available for review. 1 hr GCT and labs next visit

## 2013-06-21 ENCOUNTER — Ambulatory Visit (INDEPENDENT_AMBULATORY_CARE_PROVIDER_SITE_OTHER): Payer: Medicaid Other | Admitting: Obstetrics and Gynecology

## 2013-06-21 VITALS — BP 135/85 | Temp 97.5°F | Wt 169.2 lb

## 2013-06-21 DIAGNOSIS — Z3492 Encounter for supervision of normal pregnancy, unspecified, second trimester: Secondary | ICD-10-CM

## 2013-06-21 DIAGNOSIS — Z3490 Encounter for supervision of normal pregnancy, unspecified, unspecified trimester: Secondary | ICD-10-CM

## 2013-06-21 DIAGNOSIS — Z23 Encounter for immunization: Secondary | ICD-10-CM

## 2013-06-21 DIAGNOSIS — O99330 Smoking (tobacco) complicating pregnancy, unspecified trimester: Secondary | ICD-10-CM

## 2013-06-21 DIAGNOSIS — F17200 Nicotine dependence, unspecified, uncomplicated: Secondary | ICD-10-CM

## 2013-06-21 LAB — CBC
HCT: 33.6 % — ABNORMAL LOW (ref 36.0–46.0)
MCH: 31.5 pg (ref 26.0–34.0)
MCV: 89.6 fL (ref 78.0–100.0)
RBC: 3.75 MIL/uL — ABNORMAL LOW (ref 3.87–5.11)
RDW: 14 % (ref 11.5–15.5)
WBC: 13.6 10*3/uL — ABNORMAL HIGH (ref 4.0–10.5)

## 2013-06-21 LAB — COMPREHENSIVE METABOLIC PANEL
ALT: 22 U/L (ref 0–35)
Albumin: 3.6 g/dL (ref 3.5–5.2)
Alkaline Phosphatase: 72 U/L (ref 39–117)
BUN: 7 mg/dL (ref 6–23)
CO2: 22 mEq/L (ref 19–32)
Calcium: 8.9 mg/dL (ref 8.4–10.5)
Creat: 0.46 mg/dL — ABNORMAL LOW (ref 0.50–1.10)
Glucose, Bld: 101 mg/dL — ABNORMAL HIGH (ref 70–99)
Potassium: 3.5 mEq/L (ref 3.5–5.3)
Sodium: 136 mEq/L (ref 135–145)
Total Bilirubin: 0.3 mg/dL (ref 0.3–1.2)
Total Protein: 6.4 g/dL (ref 6.0–8.3)

## 2013-06-21 LAB — POCT URINALYSIS DIP (DEVICE)
Bilirubin Urine: NEGATIVE
Glucose, UA: NEGATIVE mg/dL
Hgb urine dipstick: NEGATIVE
Ketones, ur: NEGATIVE mg/dL
Specific Gravity, Urine: 1.01 (ref 1.005–1.030)
Urobilinogen, UA: 0.2 mg/dL (ref 0.0–1.0)

## 2013-06-21 LAB — GLUCOSE TOLERANCE, 1 HOUR (50G) W/O FASTING: Glucose, 1 Hour GTT: 104 mg/dL (ref 70–140)

## 2013-06-21 LAB — HIV ANTIBODY (ROUTINE TESTING W REFLEX): HIV: NONREACTIVE

## 2013-06-21 MED ORDER — TETANUS-DIPHTH-ACELL PERTUSSIS 5-2.5-18.5 LF-MCG/0.5 IM SUSP: 0.5000 mL | Freq: Once | INTRAMUSCULAR | Status: DC

## 2013-06-21 NOTE — Progress Notes (Signed)
Pulse: 104

## 2013-06-21 NOTE — Addendum Note (Signed)
Addended by: Faythe Casa on: 06/21/2013 09:49 AM   Modules accepted: Orders

## 2013-06-21 NOTE — Patient Instructions (Signed)
Smoking Cessation Quitting smoking is important to your health and has many advantages. However, it is not always easy to quit since nicotine is a very addictive drug. Often times, people try 3 times or more before being able to quit. This document explains the best ways for you to prepare to quit smoking. Quitting takes hard work and a lot of effort, but you can do it. ADVANTAGES OF QUITTING SMOKING  You will live longer, feel better, and live better.  Your body will feel the impact of quitting smoking almost immediately.  Within 20 minutes, blood pressure decreases. Your pulse returns to its normal level.  After 8 hours, carbon monoxide levels in the blood return to normal. Your oxygen level increases.  After 24 hours, the chance of having a heart attack starts to decrease. Your breath, hair, and body stop smelling like smoke.  After 48 hours, damaged nerve endings begin to recover. Your sense of taste and smell improve.  After 72 hours, the body is virtually free of nicotine. Your bronchial tubes relax and breathing becomes easier.  After 2 to 12 weeks, lungs can hold more air. Exercise becomes easier and circulation improves.  The risk of having a heart attack, stroke, cancer, or lung disease is greatly reduced.  After 1 year, the risk of coronary heart disease is cut in half.  After 5 years, the risk of stroke falls to the same as a nonsmoker.  After 10 years, the risk of lung cancer is cut in half and the risk of other cancers decreases significantly.  After 15 years, the risk of coronary heart disease drops, usually to the level of a nonsmoker.  If you are pregnant, quitting smoking will improve your chances of having a healthy baby.  The people you live with, especially any children, will be healthier.  You will have extra money to spend on things other than cigarettes. QUESTIONS TO THINK ABOUT BEFORE ATTEMPTING TO QUIT You may want to talk about your answers with your  caregiver.  Why do you want to quit?  If you tried to quit in the past, what helped and what did not?  What will be the most difficult situations for you after you quit? How will you plan to handle them?  Who can help you through the tough times? Your family? Friends? A caregiver?  What pleasures do you get from smoking? What ways can you still get pleasure if you quit? Here are some questions to ask your caregiver:  How can you help me to be successful at quitting?  What medicine do you think would be best for me and how should I take it?  What should I do if I need more help?  What is smoking withdrawal like? How can I get information on withdrawal? GET READY  Set a quit date.  Change your environment by getting rid of all cigarettes, ashtrays, matches, and lighters in your home, car, or work. Do not let people smoke in your home.  Review your past attempts to quit. Think about what worked and what did not. GET SUPPORT AND ENCOURAGEMENT You have a better chance of being successful if you have help. You can get support in many ways.  Tell your family, friends, and co-workers that you are going to quit and need their support. Ask them not to smoke around you.  Get individual, group, or telephone counseling and support. Programs are available at local hospitals and health centers. Call your local health department for   information about programs in your area.  Spiritual beliefs and practices may help some smokers quit.  Download a "quit meter" on your computer to keep track of quit statistics, such as how long you have gone without smoking, cigarettes not smoked, and money saved.  Get a self-help book about quitting smoking and staying off of tobacco. LEARN NEW SKILLS AND BEHAVIORS  Distract yourself from urges to smoke. Talk to someone, go for a walk, or occupy your time with a task.  Change your normal routine. Take a different route to work. Drink tea instead of coffee.  Eat breakfast in a different place.  Reduce your stress. Take a hot bath, exercise, or read a book.  Plan something enjoyable to do every day. Reward yourself for not smoking.  Explore interactive web-based programs that specialize in helping you quit. GET MEDICINE AND USE IT CORRECTLY Medicines can help you stop smoking and decrease the urge to smoke. Combining medicine with the above behavioral methods and support can greatly increase your chances of successfully quitting smoking.  Nicotine replacement therapy helps deliver nicotine to your body without the negative effects and risks of smoking. Nicotine replacement therapy includes nicotine gum, lozenges, inhalers, nasal sprays, and skin patches. Some may be available over-the-counter and others require a prescription.  Antidepressant medicine helps people abstain from smoking, but how this works is unknown. This medicine is available by prescription.  Nicotinic receptor partial agonist medicine simulates the effect of nicotine in your brain. This medicine is available by prescription. Ask your caregiver for advice about which medicines to use and how to use them based on your health history. Your caregiver will tell you what side effects to look out for if you choose to be on a medicine or therapy. Carefully read the information on the package. Do not use any other product containing nicotine while using a nicotine replacement product.  RELAPSE OR DIFFICULT SITUATIONS Most relapses occur within the first 3 months after quitting. Do not be discouraged if you start smoking again. Remember, most people try several times before finally quitting. You may have symptoms of withdrawal because your body is used to nicotine. You may crave cigarettes, be irritable, feel very hungry, cough often, get headaches, or have difficulty concentrating. The withdrawal symptoms are only temporary. They are strongest when you first quit, but they will go away within  10 14 days. To reduce the chances of relapse, try to:  Avoid drinking alcohol. Drinking lowers your chances of successfully quitting.  Reduce the amount of caffeine you consume. Once you quit smoking, the amount of caffeine in your body increases and can give you symptoms, such as a rapid heartbeat, sweating, and anxiety.  Avoid smokers because they can make you want to smoke.  Do not let weight gain distract you. Many smokers will gain weight when they quit, usually less than 10 pounds. Eat a healthy diet and stay active. You can always lose the weight gained after you quit.  Find ways to improve your mood other than smoking. FOR MORE INFORMATION  www.smokefree.gov  Document Released: 06/16/2001 Document Revised: 12/22/2011 Document Reviewed: 10/01/2011 [employer] Patient Information 2014 [employer].  

## 2013-06-21 NOTE — Progress Notes (Signed)
Saw pt and agree. FH 27-28. Discussed social stressors and smoking cessation

## 2013-06-21 NOTE — Progress Notes (Signed)
Reports previous PIH @ 36 wks (reports readings in the 200s) and was put on bedrest; Denies vision changes or HA today - recent started smoking due to stress in life: Daughter 32 yrs old w/ Hx of CA, now with hearing difficulties; Husband w/ recent hernia surgery; - Good Fetal movement; Denies LOF or VB - Recheck BP < 140/90 - Will check Cr today

## 2013-07-06 NOTE — L&D Delivery Note (Signed)
Delivery Note At 12:29 PM a viable female was delivered via  (Presentation:OA>LOA).  APGAR 8@1  min, 9@5min : , ; weight pending.   Placenta status: spontaneous, intact, .  Cord: 3V with the following complications:none .   Anesthesia: none Episiotomy: none Lacerations: none Suture Repair: n/a Est. Blood Loss (mL): 300 Filed Vitals:   09/04/13 1101 09/04/13 1121 09/04/13 1132 09/04/13 1200  BP:  165/81 139/69 150/79  Pulse:  76 70 82  Temp:    98.4 F (36.9 C)  TempSrc:    Axillary  Resp: 20   18  Height:      Weight:       Mom to AICU for 24 hr MgSO4.  Baby to Couplet care / Skin to Skin.  Pavan Bring 09/04/2013, 12:41 PM

## 2013-07-06 NOTE — L&D Delivery Note (Signed)
Attestation of Attending Supervision of Advanced Practitioner (CNM/NP): Evaluation and management procedures were performed by the Advanced Practitioner under my supervision and collaboration.  I have reviewed the Advanced Practitioner's note and chart, and I agree with the management and plan.  HARRAWAY-SMITH, Yvan Dority 2:18 PM

## 2013-07-12 ENCOUNTER — Encounter: Payer: Medicaid Other | Admitting: Family Medicine

## 2013-07-18 ENCOUNTER — Ambulatory Visit (INDEPENDENT_AMBULATORY_CARE_PROVIDER_SITE_OTHER): Payer: Medicaid Other | Admitting: Family

## 2013-07-18 VITALS — BP 141/83 | Wt 175.7 lb

## 2013-07-18 DIAGNOSIS — Z3490 Encounter for supervision of normal pregnancy, unspecified, unspecified trimester: Secondary | ICD-10-CM

## 2013-07-18 DIAGNOSIS — H9190 Unspecified hearing loss, unspecified ear: Secondary | ICD-10-CM

## 2013-07-18 DIAGNOSIS — Z3480 Encounter for supervision of other normal pregnancy, unspecified trimester: Secondary | ICD-10-CM

## 2013-07-18 LAB — POCT URINALYSIS DIP (DEVICE)
Bilirubin Urine: NEGATIVE
GLUCOSE, UA: NEGATIVE mg/dL
Ketones, ur: NEGATIVE mg/dL
Leukocytes, UA: NEGATIVE
Nitrite: NEGATIVE
PROTEIN: NEGATIVE mg/dL
SPECIFIC GRAVITY, URINE: 1.02 (ref 1.005–1.030)
Urobilinogen, UA: 0.2 mg/dL (ref 0.0–1.0)
pH: 7.5 (ref 5.0–8.0)

## 2013-07-18 NOTE — Progress Notes (Signed)
Doing well, stopped smoking.  Desires BTL > obtain consent.

## 2013-07-18 NOTE — Progress Notes (Signed)
P= 86 Edema in hands and feet.

## 2013-07-27 ENCOUNTER — Encounter (HOSPITAL_COMMUNITY): Payer: Self-pay | Admitting: *Deleted

## 2013-08-02 ENCOUNTER — Ambulatory Visit (INDEPENDENT_AMBULATORY_CARE_PROVIDER_SITE_OTHER): Payer: Medicaid Other | Admitting: Advanced Practice Midwife

## 2013-08-02 ENCOUNTER — Encounter: Payer: Self-pay | Admitting: Advanced Practice Midwife

## 2013-08-02 VITALS — BP 140/87 | Wt 177.5 lb

## 2013-08-02 DIAGNOSIS — O139 Gestational [pregnancy-induced] hypertension without significant proteinuria, unspecified trimester: Secondary | ICD-10-CM

## 2013-08-02 DIAGNOSIS — Z3490 Encounter for supervision of normal pregnancy, unspecified, unspecified trimester: Secondary | ICD-10-CM

## 2013-08-02 DIAGNOSIS — O133 Gestational [pregnancy-induced] hypertension without significant proteinuria, third trimester: Secondary | ICD-10-CM

## 2013-08-02 LAB — CBC
HCT: 32.4 % — ABNORMAL LOW (ref 36.0–46.0)
HEMOGLOBIN: 11.3 g/dL — AB (ref 12.0–15.0)
MCH: 31.7 pg (ref 26.0–34.0)
MCHC: 34.9 g/dL (ref 30.0–36.0)
MCV: 90.8 fL (ref 78.0–100.0)
Platelets: 228 10*3/uL (ref 150–400)
RBC: 3.57 MIL/uL — AB (ref 3.87–5.11)
RDW: 14 % (ref 11.5–15.5)
WBC: 13.8 10*3/uL — ABNORMAL HIGH (ref 4.0–10.5)

## 2013-08-02 LAB — POCT URINALYSIS DIP (DEVICE)
BILIRUBIN URINE: NEGATIVE
GLUCOSE, UA: NEGATIVE mg/dL
KETONES UR: NEGATIVE mg/dL
Leukocytes, UA: NEGATIVE
Nitrite: NEGATIVE
Protein, ur: NEGATIVE mg/dL
Specific Gravity, Urine: 1.015 (ref 1.005–1.030)
Urobilinogen, UA: 0.2 mg/dL (ref 0.0–1.0)
pH: 7.5 (ref 5.0–8.0)

## 2013-08-02 LAB — COMPREHENSIVE METABOLIC PANEL
ALT: 15 U/L (ref 0–35)
AST: 19 U/L (ref 0–37)
Albumin: 3.4 g/dL — ABNORMAL LOW (ref 3.5–5.2)
Alkaline Phosphatase: 91 U/L (ref 39–117)
BILIRUBIN TOTAL: 0.3 mg/dL (ref 0.2–1.2)
BUN: 4 mg/dL — ABNORMAL LOW (ref 6–23)
CALCIUM: 9.1 mg/dL (ref 8.4–10.5)
CHLORIDE: 104 meq/L (ref 96–112)
CO2: 25 meq/L (ref 19–32)
CREATININE: 0.31 mg/dL — AB (ref 0.50–1.10)
GLUCOSE: 76 mg/dL (ref 70–99)
Potassium: 3.7 mEq/L (ref 3.5–5.3)
Sodium: 137 mEq/L (ref 135–145)
Total Protein: 5.9 g/dL — ABNORMAL LOW (ref 6.0–8.3)

## 2013-08-02 NOTE — Patient Instructions (Signed)
Hypertension During Pregnancy Hypertension is also called high blood pressure. It can occur at any time in life and during pregnancy. When you have hypertension, there is extra pressure inside your blood vessels that carry blood from the heart to the rest of your body (arteries). Hypertension during pregnancy can cause problems for you and your baby. Your baby might not weigh as much as it should at birth or might be born early (premature). Very bad cases of hypertension during pregnancy can be life threatening.  Different types of hypertension can occur during pregnancy.   Chronic hypertension. This happens when a woman has hypertension before pregnancy and it continues during pregnancy.  Gestational hypertension. This is when hypertension develops during pregnancy.  Preeclampsia or toxemia of pregnancy. This is a very serious type of hypertension that develops only during pregnancy. It is a disease that affects the whole body (systemic) and can be very dangerous for both mother and baby.  Gestational hypertension and preeclampsia usually go away after your baby is born. Blood pressure generally stabilizes within 6 weeks. Women who have hypertension during pregnancy have a greater chance of developing hypertension later in life or with future pregnancies. RISK FACTORS Some factors make you more likely to develop hypertension during pregnancy. Risk factors include:  Having hypertension before pregnancy.  Having hypertension during a previous pregnancy.  Being overweight.  Being older than 40.  Being pregnant with more than one baby (multiples).  Having diabetes or kidney problems. SIGNS AND SYMPTOMS Chronic and gestational hypertension rarely cause symptoms. Preeclampsia has symptoms, which may include:  Increased protein in your urine. Your health care provider will check for this at every prenatal visit.  Swelling of your hands and face.  Rapid weight gain.  Headaches.  Visual  changes.  Being bothered by light.  Abdominal pain, especially in the right upper area.  Chest pain.  Shortness of breath.  Increased reflexes.  Seizures. Seizures occur with a more severe form of preeclampsia, called eclampsia. DIAGNOSIS   You may be diagnosed with hypertension during a regular prenatal exam. At each visit, tests may include:  Blood pressure checks.  A urine test to check for protein in your urine.  The type of hypertension you are diagnosed with depends on when you developed it. It also depends on your specific blood pressure reading.  Developing hypertension before 20 weeks of pregnancy is consistent with chronic hypertension.  Developing hypertension after 20 weeks of pregnancy is consistent with gestational hypertension.  Hypertension with increased urinary protein is diagnosed as preeclampsia.  Blood pressure measurements that stay above 160 systolic or 110 diastolic are a sign of severe preeclampsia. TREATMENT Treatment for hypertension during pregnancy varies. Treatment depends on the type of hypertension and how serious it is.  If you take medicine for chronic hypertension, you may need to switch medicines.  Drugs called ACE inhibitors should not be taken during pregnancy.  Low-dose aspirin may be suggested for women who have risk factors for preeclampsia.  If you have gestational hypertension, you may need to take a blood pressure medicine that is safe during pregnancy. Your health care provider will recommend the appropriate medicine.  If you have severe preeclampsia, you may need to be in the hospital. Health care providers will watch you and the baby very closely. You also may need to take medicine (magnesium sulfate) to prevent seizures and lower blood pressure.  Sometimes an early delivery is needed. This may be the case if the condition worsens. It would   be done to protect you and the baby. The only cure for preeclampsia is delivery. HOME  CARE INSTRUCTIONS  Schedule and keep all of your regular appointments for prenatal care.  Only take over-the-counter or prescription medicines as directed by your health care provider. Tell your health care provider about all medicines you take.  Eat as little salt as possible.  Get regular exercise.  Do not drink alcohol.  Do not use tobacco products.  Do not drink products with caffeine.  Lie on your left side when resting. SEEK IMMEDIATE MEDICAL CARE IF:  You have severe abdominal pain.  You have sudden swelling in the hands, ankles, or face.  You gain 4 pounds (1.8 kg) or more in 1 week.  You vomit repeatedly.  You have vaginal bleeding.  You do not feel the baby moving as much.  You have a headache.  You have blurred or double vision.  You have muscle twitching or spasms.  You have shortness of breath.  You have blue fingernails and lips.  You have blood in your urine. MAKE SURE YOU:  Understand these instructions.  Will watch your condition.  Will get help right away if you are not doing well or get worse. Document Released: 03/10/2011 Document Revised: 04/12/2013 Document Reviewed: 01/19/2013 [facility] Patient Information 2014 [place], [place].  Fetal Movement Counts Patient Name: __________________________________________________ Patient Due Date: ____________________ Performing a fetal movement count is highly recommended in high-risk pregnancies, but it is good for every pregnant woman to do. Your caregiver may ask you to start counting fetal movements at 28 weeks of the pregnancy. Fetal movements often increase:  After eating a full meal.  After physical activity.  After eating or drinking something sweet or cold.  At rest. Pay attention to when you feel the baby is most active. This will help you notice a pattern of your baby's sleep and wake cycles and what factors contribute to an increase in fetal movement. It is important to perform a fetal  movement count at the same time each day when your baby is normally most active.  HOW TO COUNT FETAL MOVEMENTS 1. Find a quiet and comfortable area to sit or lie down on your left side. Lying on your left side provides the best blood and oxygen circulation to your baby. 2. Write down the day and time on a sheet of paper or in a journal. 3. Start counting kicks, flutters, swishes, rolls, or jabs in a 2 hour period. You should feel at least 10 movements within 2 hours. 4. If you do not feel 10 movements in 2 hours, wait 2 3 hours and count again. Look for a change in the pattern or not enough counts in 2 hours. SEEK MEDICAL CARE IF:  You feel less than 10 counts in 2 hours, tried twice.  There is no movement in over an hour.  The pattern is changing or taking longer each day to reach 10 counts in 2 hours.  You feel the baby is not moving as he or she usually does. Date: ____________ Movements: ____________ Start time: ____________ Alicia Ortiz time: ____________  Date: ____________ Movements: ____________ Start time: ____________ Alicia Ortiz time: ____________ Date: ____________ Movements: ____________ Start time: ____________ Alicia Ortiz time: ____________ Date: ____________ Movements: ____________ Start time: ____________ Alicia Ortiz time: ____________ Date: ____________ Movements: ____________ Start time: ____________ Alicia Ortiz time: ____________ Date: ____________ Movements: ____________ Start time: ____________ Alicia Ortiz time: ____________ Date: ____________ Movements: ____________ Start time: ____________ Alicia Ortiz time: ____________ Date: ____________ Movements: ____________ Start time:  ____________ Alicia MartinFinish time: ____________  Date: ____________ Movements: ____________ Start time: ____________ Alicia MartinFinish time: ____________ Date: ____________ Movements: ____________ Start time: ____________ Alicia MartinFinish time: ____________ Date: ____________ Movements: ____________ Start time: ____________ Alicia MartinFinish time: ____________ Date:  ____________ Movements: ____________ Start time: ____________ Alicia MartinFinish time: ____________ Date: ____________ Movements: ____________ Start time: ____________ Alicia MartinFinish time: ____________ Date: ____________ Movements: ____________ Start time: ____________ Alicia MartinFinish time: ____________ Date: ____________ Movements: ____________ Start time: ____________ Alicia MartinFinish time: ____________  Date: ____________ Movements: ____________ Start time: ____________ Alicia MartinFinish time: ____________ Date: ____________ Movements: ____________ Start time: ____________ Alicia MartinFinish time: ____________ Date: ____________ Movements: ____________ Start time: ____________ Alicia MartinFinish time: ____________ Date: ____________ Movements: ____________ Start time: ____________ Alicia MartinFinish time: ____________ Date: ____________ Movements: ____________ Start time: ____________ Alicia MartinFinish time: ____________ Date: ____________ Movements: ____________ Start time: ____________ Alicia MartinFinish time: ____________ Date: ____________ Movements: ____________ Start time: ____________ Alicia MartinFinish time: ____________  Date: ____________ Movements: ____________ Start time: ____________ Alicia MartinFinish time: ____________ Date: ____________ Movements: ____________ Start time: ____________ Alicia MartinFinish time: ____________ Date: ____________ Movements: ____________ Start time: ____________ Alicia MartinFinish time: ____________ Date: ____________ Movements: ____________ Start time: ____________ Alicia MartinFinish time: ____________ Date: ____________ Movements: ____________ Start time: ____________ Alicia MartinFinish time: ____________ Date: ____________ Movements: ____________ Start time: ____________ Alicia MartinFinish time: ____________ Date: ____________ Movements: ____________ Start time: ____________ Alicia MartinFinish time: ____________  Date: ____________ Movements: ____________ Start time: ____________ Alicia MartinFinish time: ____________ Date: ____________ Movements: ____________ Start time: ____________ Alicia MartinFinish time: ____________ Date: ____________ Movements: ____________ Start  time: ____________ Alicia MartinFinish time: ____________ Date: ____________ Movements: ____________ Start time: ____________ Alicia MartinFinish time: ____________ Date: ____________ Movements: ____________ Start time: ____________ Alicia MartinFinish time: ____________ Date: ____________ Movements: ____________ Start time: ____________ Alicia MartinFinish time: ____________ Date: ____________ Movements: ____________ Start time: ____________ Alicia MartinFinish time: ____________  Date: ____________ Movements: ____________ Start time: ____________ Alicia MartinFinish time: ____________ Date: ____________ Movements: ____________ Start time: ____________ Alicia MartinFinish time: ____________ Date: ____________ Movements: ____________ Start time: ____________ Alicia MartinFinish time: ____________ Date: ____________ Movements: ____________ Start time: ____________ Alicia MartinFinish time: ____________ Date: ____________ Movements: ____________ Start time: ____________ Alicia MartinFinish time: ____________ Date: ____________ Movements: ____________ Start time: ____________ Alicia MartinFinish time: ____________ Date: ____________ Movements: ____________ Start time: ____________ Alicia MartinFinish time: ____________  Date: ____________ Movements: ____________ Start time: ____________ Alicia MartinFinish time: ____________ Date: ____________ Movements: ____________ Start time: ____________ Alicia MartinFinish time: ____________ Date: ____________ Movements: ____________ Start time: ____________ Alicia MartinFinish time: ____________ Date: ____________ Movements: ____________ Start time: ____________ Alicia MartinFinish time: ____________ Date: ____________ Movements: ____________ Start time: ____________ Alicia MartinFinish time: ____________ Date: ____________ Movements: ____________ Start time: ____________ Alicia MartinFinish time: ____________ Date: ____________ Movements: ____________ Start time: ____________ Alicia MartinFinish time: ____________  Date: ____________ Movements: ____________ Start time: ____________ Alicia MartinFinish time: ____________ Date: ____________ Movements: ____________ Start time: ____________ Alicia MartinFinish time:  ____________ Date: ____________ Movements: ____________ Start time: ____________ Alicia MartinFinish time: ____________ Date: ____________ Movements: ____________ Start time: ____________ Alicia MartinFinish time: ____________ Date: ____________ Movements: ____________ Start time: ____________ Alicia MartinFinish time: ____________ Date: ____________ Movements: ____________ Start time: ____________ Alicia MartinFinish time: ____________ Document Released: 07/22/2006 Document Revised: 06/08/2012 Document Reviewed: 04/18/2012 [employer] Patient Information 2014 [employer]

## 2013-08-02 NOTE — Progress Notes (Signed)
Start antenatal testing for Gest HTN. CBC, CMET, P:C.

## 2013-08-02 NOTE — Progress Notes (Signed)
Pulse: 82

## 2013-08-03 ENCOUNTER — Encounter (HOSPITAL_COMMUNITY): Payer: Self-pay | Admitting: *Deleted

## 2013-08-03 LAB — PROTEIN / CREATININE RATIO, URINE: CREATININE, URINE: 29.1 mg/dL

## 2013-08-07 ENCOUNTER — Telehealth: Payer: Self-pay | Admitting: General Practice

## 2013-08-07 ENCOUNTER — Encounter: Payer: Self-pay | Admitting: *Deleted

## 2013-08-07 ENCOUNTER — Ambulatory Visit (INDEPENDENT_AMBULATORY_CARE_PROVIDER_SITE_OTHER): Payer: Medicaid Other | Admitting: *Deleted

## 2013-08-07 VITALS — BP 130/73 | Wt 179.9 lb

## 2013-08-07 DIAGNOSIS — O133 Gestational [pregnancy-induced] hypertension without significant proteinuria, third trimester: Secondary | ICD-10-CM | POA: Insufficient documentation

## 2013-08-07 DIAGNOSIS — O139 Gestational [pregnancy-induced] hypertension without significant proteinuria, unspecified trimester: Secondary | ICD-10-CM

## 2013-08-07 NOTE — Progress Notes (Signed)
NST reviewed and reactive.  Chey Cho L. Harraway-Smith, M.D., FACOG    

## 2013-08-07 NOTE — Telephone Encounter (Signed)
Patient called and left message stating she was seen here on 1/28 to see Dr Jolayne Pantheronstant and have labs drawn and would like a call back with the lab results. Had Dr Penne LashLeggett review patient's lab work, results normal. Called patient and informed her of normal test results. Patient verbalized understanding and had no further questions

## 2013-08-07 NOTE — Progress Notes (Signed)
P=85,

## 2013-08-08 ENCOUNTER — Encounter (HOSPITAL_COMMUNITY): Payer: Self-pay | Admitting: *Deleted

## 2013-08-10 ENCOUNTER — Ambulatory Visit (INDEPENDENT_AMBULATORY_CARE_PROVIDER_SITE_OTHER): Payer: Medicaid Other | Admitting: Obstetrics and Gynecology

## 2013-08-10 ENCOUNTER — Ambulatory Visit (HOSPITAL_COMMUNITY)
Admission: RE | Admit: 2013-08-10 | Discharge: 2013-08-10 | Disposition: A | Discharge status: Home / Self Care | Payer: Medicaid Other | Source: Ambulatory Visit | Attending: Advanced Practice Midwife | Admitting: Advanced Practice Midwife

## 2013-08-10 VITALS — BP 137/89 | Temp 97.7°F | Wt 178.8 lb

## 2013-08-10 DIAGNOSIS — O133 Gestational [pregnancy-induced] hypertension without significant proteinuria, third trimester: Secondary | ICD-10-CM

## 2013-08-10 DIAGNOSIS — Z3689 Encounter for other specified antenatal screening: Secondary | ICD-10-CM | POA: Insufficient documentation

## 2013-08-10 DIAGNOSIS — O139 Gestational [pregnancy-induced] hypertension without significant proteinuria, unspecified trimester: Secondary | ICD-10-CM | POA: Insufficient documentation

## 2013-08-10 DIAGNOSIS — Z3480 Encounter for supervision of other normal pregnancy, unspecified trimester: Secondary | ICD-10-CM

## 2013-08-10 DIAGNOSIS — H9190 Unspecified hearing loss, unspecified ear: Secondary | ICD-10-CM

## 2013-08-10 DIAGNOSIS — Z3490 Encounter for supervision of normal pregnancy, unspecified, unspecified trimester: Secondary | ICD-10-CM

## 2013-08-10 LAB — POCT URINALYSIS DIP (DEVICE)
Bilirubin Urine: NEGATIVE
GLUCOSE, UA: NEGATIVE mg/dL
Ketones, ur: NEGATIVE mg/dL
LEUKOCYTES UA: NEGATIVE
Nitrite: NEGATIVE
Protein, ur: NEGATIVE mg/dL
Specific Gravity, Urine: 1.015 (ref 1.005–1.030)
UROBILINOGEN UA: 0.2 mg/dL (ref 0.0–1.0)
pH: 7.5 (ref 5.0–8.0)

## 2013-08-10 NOTE — Progress Notes (Signed)
Pulse- 84  Edema- hands

## 2013-08-10 NOTE — Progress Notes (Signed)
Patient is doing well without complaints. FM/PTL precautions reviewed.  NST reviewed and category I. Patient with 1 variable decel with spontaneous recovery and return to baseline and moderate variability with accels to follow.

## 2013-08-11 ENCOUNTER — Encounter (HOSPITAL_COMMUNITY): Payer: Self-pay | Admitting: *Deleted

## 2013-08-14 ENCOUNTER — Ambulatory Visit (INDEPENDENT_AMBULATORY_CARE_PROVIDER_SITE_OTHER): Payer: Medicaid Other | Admitting: *Deleted

## 2013-08-14 VITALS — BP 143/77

## 2013-08-14 DIAGNOSIS — O139 Gestational [pregnancy-induced] hypertension without significant proteinuria, unspecified trimester: Secondary | ICD-10-CM

## 2013-08-14 DIAGNOSIS — O133 Gestational [pregnancy-induced] hypertension without significant proteinuria, third trimester: Secondary | ICD-10-CM

## 2013-08-14 NOTE — Progress Notes (Signed)
NST reviewed and category I 

## 2013-08-14 NOTE — Progress Notes (Signed)
P=79 

## 2013-08-15 ENCOUNTER — Encounter (HOSPITAL_COMMUNITY): Payer: Self-pay | Admitting: *Deleted

## 2013-08-17 ENCOUNTER — Ambulatory Visit (INDEPENDENT_AMBULATORY_CARE_PROVIDER_SITE_OTHER): Payer: Medicaid Other | Admitting: Obstetrics & Gynecology

## 2013-08-17 VITALS — BP 145/78 | Temp 98.7°F | Wt 179.4 lb

## 2013-08-17 DIAGNOSIS — O139 Gestational [pregnancy-induced] hypertension without significant proteinuria, unspecified trimester: Secondary | ICD-10-CM

## 2013-08-17 DIAGNOSIS — O133 Gestational [pregnancy-induced] hypertension without significant proteinuria, third trimester: Secondary | ICD-10-CM

## 2013-08-17 DIAGNOSIS — O0993 Supervision of high risk pregnancy, unspecified, third trimester: Secondary | ICD-10-CM

## 2013-08-17 LAB — POCT URINALYSIS DIP (DEVICE)
Bilirubin Urine: NEGATIVE
Glucose, UA: NEGATIVE mg/dL
Ketones, ur: NEGATIVE mg/dL
LEUKOCYTES UA: NEGATIVE
Nitrite: NEGATIVE
PROTEIN: 30 mg/dL — AB
Specific Gravity, Urine: 1.015 (ref 1.005–1.030)
Urobilinogen, UA: 0.2 mg/dL (ref 0.0–1.0)
pH: 7 (ref 5.0–8.0)

## 2013-08-17 LAB — US OB FOLLOW UP

## 2013-08-17 NOTE — Progress Notes (Signed)
No PIH sx. NST reactive today. Right wrist CTS, rx brace

## 2013-08-17 NOTE — Progress Notes (Signed)
P= 88, c/o edema in feet , hands, face

## 2013-08-18 ENCOUNTER — Encounter (HOSPITAL_COMMUNITY): Payer: Self-pay | Admitting: *Deleted

## 2013-08-21 ENCOUNTER — Ambulatory Visit (INDEPENDENT_AMBULATORY_CARE_PROVIDER_SITE_OTHER): Payer: Medicaid Other | Admitting: *Deleted

## 2013-08-21 ENCOUNTER — Encounter: Payer: Self-pay | Admitting: *Deleted

## 2013-08-21 VITALS — BP 156/82 | Temp 98.3°F | Wt 183.8 lb

## 2013-08-21 DIAGNOSIS — O139 Gestational [pregnancy-induced] hypertension without significant proteinuria, unspecified trimester: Secondary | ICD-10-CM

## 2013-08-21 DIAGNOSIS — O133 Gestational [pregnancy-induced] hypertension without significant proteinuria, third trimester: Secondary | ICD-10-CM

## 2013-08-21 NOTE — Progress Notes (Signed)
P=98, 4 lb weight gain noted, denies headaches, visual changes. Trace edema noted. Notified Dr. Debroah LoopArnold of 4 lb weight gain, trace edema, 30 protein in last urine . Orders to do 24 hour urine, bring to next ob appt 08/24/13 and get labs then.

## 2013-08-22 ENCOUNTER — Encounter (HOSPITAL_COMMUNITY): Payer: Self-pay | Admitting: *Deleted

## 2013-08-23 NOTE — Progress Notes (Signed)
NST 08/21/13 reactive 

## 2013-08-24 ENCOUNTER — Ambulatory Visit (INDEPENDENT_AMBULATORY_CARE_PROVIDER_SITE_OTHER): Payer: Medicaid Other | Admitting: Family Medicine

## 2013-08-24 ENCOUNTER — Encounter: Payer: Self-pay | Admitting: Family Medicine

## 2013-08-24 VITALS — BP 157/88 | Temp 98.0°F | Wt 180.0 lb

## 2013-08-24 DIAGNOSIS — O0993 Supervision of high risk pregnancy, unspecified, third trimester: Secondary | ICD-10-CM

## 2013-08-24 DIAGNOSIS — O133 Gestational [pregnancy-induced] hypertension without significant proteinuria, third trimester: Secondary | ICD-10-CM

## 2013-08-24 DIAGNOSIS — O139 Gestational [pregnancy-induced] hypertension without significant proteinuria, unspecified trimester: Secondary | ICD-10-CM

## 2013-08-24 LAB — POCT URINALYSIS DIP (DEVICE)
BILIRUBIN URINE: NEGATIVE
GLUCOSE, UA: NEGATIVE mg/dL
Ketones, ur: NEGATIVE mg/dL
Nitrite: NEGATIVE
Protein, ur: NEGATIVE mg/dL
Specific Gravity, Urine: 1.01 (ref 1.005–1.030)
UROBILINOGEN UA: 0.2 mg/dL (ref 0.0–1.0)
pH: 7.5 (ref 5.0–8.0)

## 2013-08-24 LAB — US OB FOLLOW UP

## 2013-08-24 LAB — COMPREHENSIVE METABOLIC PANEL
ALBUMIN: 3.4 g/dL — AB (ref 3.5–5.2)
ALK PHOS: 112 U/L (ref 39–117)
ALT: 16 U/L (ref 0–35)
AST: 22 U/L (ref 0–37)
BUN: 3 mg/dL — AB (ref 6–23)
CO2: 25 mEq/L (ref 19–32)
CREATININE: 0.44 mg/dL — AB (ref 0.50–1.10)
Calcium: 8.9 mg/dL (ref 8.4–10.5)
Chloride: 103 mEq/L (ref 96–112)
Glucose, Bld: 74 mg/dL (ref 70–99)
Potassium: 3.3 mEq/L — ABNORMAL LOW (ref 3.5–5.3)
Sodium: 140 mEq/L (ref 135–145)
Total Bilirubin: 0.5 mg/dL (ref 0.2–1.2)
Total Protein: 5.6 g/dL — ABNORMAL LOW (ref 6.0–8.3)

## 2013-08-24 NOTE — Patient Instructions (Signed)
Third Trimester of Pregnancy  The third trimester is from week 29 through week 42, months 7 through 9. The third trimester is a time when the fetus is growing rapidly. At the end of the ninth month, the fetus is about 20 inches in length and weighs 6 10 pounds.   BODY CHANGES  Your body goes through many changes during pregnancy. The changes vary from woman to woman.    Your weight will continue to increase. You can expect to gain 25 35 pounds (11 16 kg) by the end of the pregnancy.   You may begin to get stretch marks on your hips, abdomen, and breasts.   You may urinate more often because the fetus is moving lower into your pelvis and pressing on your bladder.   You may develop or continue to have heartburn as a result of your pregnancy.   You may develop constipation because certain hormones are causing the muscles that push waste through your intestines to slow down.   You may develop hemorrhoids or swollen, bulging veins (varicose veins).   You may have pelvic pain because of the weight gain and pregnancy hormones relaxing your joints between the bones in your pelvis. Back aches may result from over exertion of the muscles supporting your posture.   Your breasts will continue to grow and be tender. A yellow discharge may leak from your breasts called colostrum.   Your belly button may stick out.   You may feel short of breath because of your expanding uterus.   You may notice the fetus "dropping," or moving lower in your abdomen.   You may have a bloody mucus discharge. This usually occurs a few days to a week before labor begins.   Your cervix becomes thin and soft (effaced) near your due date.  WHAT TO EXPECT AT YOUR PRENATAL EXAMS   You will have prenatal exams every 2 weeks until week 36. Then, you will have weekly prenatal exams. During a routine prenatal visit:   You will be weighed to make sure you and the fetus are growing normally.   Your blood pressure is taken.   Your abdomen will be  measured to track your baby's growth.   The fetal heartbeat will be listened to.   Any test results from the previous visit will be discussed.   You may have a cervical check near your due date to see if you have effaced.  At around 36 weeks, your caregiver will check your cervix. At the same time, your caregiver will also perform a test on the secretions of the vaginal tissue. This test is to determine if a type of bacteria, Group B streptococcus, is present. Your caregiver will explain this further.  Your caregiver may ask you:   What your birth plan is.   How you are feeling.   If you are feeling the baby move.   If you have had any abnormal symptoms, such as leaking fluid, bleeding, severe headaches, or abdominal cramping.   If you have any questions.  Other tests or screenings that may be performed during your third trimester include:   Blood tests that check for low iron levels (anemia).   Fetal testing to check the health, activity level, and growth of the fetus. Testing is done if you have certain medical conditions or if there are problems during the pregnancy.  FALSE LABOR  You may feel small, irregular contractions that eventually go away. These are called Braxton Hicks contractions, or   false labor. Contractions may last for hours, days, or even weeks before true labor sets in. If contractions come at regular intervals, intensify, or become painful, it is best to be seen by your caregiver.   SIGNS OF LABOR    Menstrual-like cramps.   Contractions that are 5 minutes apart or less.   Contractions that start on the top of the uterus and spread down to the lower abdomen and back.   A sense of increased pelvic pressure or back pain.   A watery or bloody mucus discharge that comes from the vagina.  If you have any of these signs before the 37th week of pregnancy, call your caregiver right away. You need to go to the hospital to get checked immediately.  HOME CARE INSTRUCTIONS    Avoid all  smoking, herbs, alcohol, and unprescribed drugs. These chemicals affect the formation and growth of the baby.   Follow your caregiver's instructions regarding medicine use. There are medicines that are either safe or unsafe to take during pregnancy.   Exercise only as directed by your caregiver. Experiencing uterine cramps is a good sign to stop exercising.   Continue to eat regular, healthy meals.   Wear a good support bra for breast tenderness.   Do not use hot tubs, steam rooms, or saunas.   Wear your seat belt at all times when driving.   Avoid raw meat, uncooked cheese, cat litter boxes, and soil used by cats. These carry germs that can cause birth defects in the baby.   Take your prenatal vitamins.   Try taking a stool softener (if your caregiver approves) if you develop constipation. Eat more high-fiber foods, such as fresh vegetables or fruit and whole grains. Drink plenty of fluids to keep your urine clear or pale yellow.   Take warm sitz baths to soothe any pain or discomfort caused by hemorrhoids. Use hemorrhoid cream if your caregiver approves.   If you develop varicose veins, wear support hose. Elevate your feet for 15 minutes, 3 4 times a day. Limit salt in your diet.   Avoid heavy lifting, wear low heal shoes, and practice good posture.   Rest a lot with your legs elevated if you have leg cramps or low back pain.   Visit your dentist if you have not gone during your pregnancy. Use a soft toothbrush to brush your teeth and be gentle when you floss.   A sexual relationship may be continued unless your caregiver directs you otherwise.   Do not travel far distances unless it is absolutely necessary and only with the approval of your caregiver.   Take prenatal classes to understand, practice, and ask questions about the labor and delivery.   Make a trial run to the hospital.   Pack your hospital bag.   Prepare the baby's nursery.   Continue to go to all your prenatal visits as directed  by your caregiver.  SEEK MEDICAL CARE IF:   You are unsure if you are in labor or if your water has broken.   You have dizziness.   You have mild pelvic cramps, pelvic pressure, or nagging pain in your abdominal area.   You have persistent nausea, vomiting, or diarrhea.   You have a bad smelling vaginal discharge.   You have pain with urination.  SEEK IMMEDIATE MEDICAL CARE IF:    You have a fever.   You are leaking fluid from your vagina.   You have spotting or bleeding from your vagina.     You have severe abdominal cramping or pain.   You have rapid weight loss or gain.   You have shortness of breath with chest pain.   You notice sudden or extreme swelling of your face, hands, ankles, feet, or legs.   You have not felt your baby move in over an hour.   You have severe headaches that do not go away with medicine.   You have vision changes.  Document Released: 06/16/2001 Document Revised: 02/22/2013 Document Reviewed: 08/23/2012  [employer] Patient Information 2014 [employer].

## 2013-08-24 NOTE — Addendum Note (Signed)
Addended by: Minta BalsamDOM, Jasmeet Gehl R on: 08/24/2013 12:19 PM   Modules accepted: Orders

## 2013-08-24 NOTE — Progress Notes (Signed)
Reactive NST for elevated BP. Continues NST/AFI IOL @ 40wks Growth scan q4wks - next 3/4  Minimal swelling +FM, no LOF, noVB, no Ctx  No HA, no vision issues, no RUQ pain  Alicia Ortiz is a 33 y.o. G3P1011 at 4580w4d by L=15 here for ROB visit.  Discussed with Patient:  -Plans to breast feed.  All questions answered. -Continue prenatal vitamins. -Reviewed fetal kick counts Pt to perform daily at a time when the baby is active, lie laterally with both hands on belly in quiet room and count all movements (hiccups, shoulder rolls, obvious kicks, etc); pt is to report to clinic L&D for less than 10 movements felt in a one hour time period-pt told as soon as she counts 10 movements the count is complete.  - Routine precautions discussed (depression, infection s/s).   Patient provided with all pertinent phone numbers for emergencies. - RTC for any VB, regular, painful cramps/ctxs occurring at a rate of >2/10 min, fever (100.5 or higher), n/v/d, any pain that is unresolving or worsening, LOF, decreased fetal movement, CP, SOB, edema - RTC in 1 weeks for next appt.   Problems: Patient Active Problem List   Diagnosis Date Noted  . Gestational hypertension w/o significant proteinuria in 3rd trimester 08/07/2013  . Deaf 03/28/2013  . Supervision of high risk pregnancy in third trimester 03/28/2013    To Do:   [ ]  Vaccines: recd [ x] BCM: BTL  [x]  Readiness: baby has a place to sleep, car seat, other baby necessities.  Edu: [x ] PTL precautions; [ ]  BF class; [ ]  childbirth class; [ ]   BF counseling;

## 2013-08-24 NOTE — Addendum Note (Signed)
Addended by: Franchot MimesALFARO, Gisell Buehrle on: 08/24/2013 02:18 PM   Modules accepted: Orders

## 2013-08-28 ENCOUNTER — Ambulatory Visit (INDEPENDENT_AMBULATORY_CARE_PROVIDER_SITE_OTHER): Payer: Medicaid Other | Admitting: *Deleted

## 2013-08-28 VITALS — BP 138/77

## 2013-08-28 DIAGNOSIS — O133 Gestational [pregnancy-induced] hypertension without significant proteinuria, third trimester: Secondary | ICD-10-CM

## 2013-08-28 DIAGNOSIS — O139 Gestational [pregnancy-induced] hypertension without significant proteinuria, unspecified trimester: Secondary | ICD-10-CM

## 2013-08-28 LAB — PROTEIN, URINE, 24 HOUR
Protein, 24H Urine: 210 mg/d — ABNORMAL HIGH (ref 50–100)
Protein, Urine: 8 mg/dL

## 2013-08-28 LAB — CREATININE CLEARANCE, URINE, 24 HOUR
CREATININE 24H UR: 1533 mg/d (ref 700–1800)
Creatinine Clearance: 242 mL/min — ABNORMAL HIGH (ref 75–115)
Creatinine, Urine: 58.4 mg/dL
Creatinine: 0.44 mg/dL — ABNORMAL LOW (ref 0.50–1.10)

## 2013-08-28 NOTE — Progress Notes (Signed)
P-82 

## 2013-08-28 NOTE — Progress Notes (Signed)
2/23 NST reviewed and reactive 

## 2013-08-31 ENCOUNTER — Telehealth (HOSPITAL_COMMUNITY): Payer: Self-pay | Admitting: *Deleted

## 2013-08-31 ENCOUNTER — Ambulatory Visit (INDEPENDENT_AMBULATORY_CARE_PROVIDER_SITE_OTHER): Payer: Medicaid Other | Admitting: Obstetrics and Gynecology

## 2013-08-31 ENCOUNTER — Encounter (HOSPITAL_COMMUNITY): Payer: Self-pay | Admitting: *Deleted

## 2013-08-31 VITALS — BP 157/85 | Wt 183.7 lb

## 2013-08-31 DIAGNOSIS — O139 Gestational [pregnancy-induced] hypertension without significant proteinuria, unspecified trimester: Secondary | ICD-10-CM

## 2013-08-31 DIAGNOSIS — O133 Gestational [pregnancy-induced] hypertension without significant proteinuria, third trimester: Secondary | ICD-10-CM

## 2013-08-31 DIAGNOSIS — H9190 Unspecified hearing loss, unspecified ear: Secondary | ICD-10-CM

## 2013-08-31 DIAGNOSIS — O0993 Supervision of high risk pregnancy, unspecified, third trimester: Secondary | ICD-10-CM

## 2013-08-31 DIAGNOSIS — O0990 Supervision of high risk pregnancy, unspecified, unspecified trimester: Secondary | ICD-10-CM

## 2013-08-31 LAB — POCT URINALYSIS DIP (DEVICE)
BILIRUBIN URINE: NEGATIVE
GLUCOSE, UA: NEGATIVE mg/dL
Ketones, ur: NEGATIVE mg/dL
LEUKOCYTES UA: NEGATIVE
Nitrite: NEGATIVE
Protein, ur: NEGATIVE mg/dL
Specific Gravity, Urine: 1.01 (ref 1.005–1.030)
Urobilinogen, UA: 0.2 mg/dL (ref 0.0–1.0)
pH: 7 (ref 5.0–8.0)

## 2013-08-31 LAB — OB RESULTS CONSOLE GBS
GBS: NEGATIVE
STREP GROUP B AG: POSITIVE

## 2013-08-31 LAB — OB RESULTS CONSOLE GC/CHLAMYDIA
Chlamydia: NEGATIVE
Gonorrhea: NEGATIVE

## 2013-08-31 NOTE — Progress Notes (Signed)
Patient is doing well without complaints. Persistently elevated BP. Plan for IOL at 37 weeks for GHTN on 09/03/2013. Cultures collected today. NST reviewed and reactive

## 2013-08-31 NOTE — Progress Notes (Signed)
P = 91     Pt denies H/a or visual disturbances.   Pt using wrist splints to both arms for Carpal Tunnel- states they are helping with the pain and swelling.  IOL on 3/1 @ 0700.

## 2013-08-31 NOTE — Telephone Encounter (Signed)
Preadmission screen  

## 2013-09-01 LAB — GC/CHLAMYDIA PROBE AMP
CT Probe RNA: NEGATIVE
GC PROBE AMP APTIMA: NEGATIVE

## 2013-09-02 LAB — CULTURE, BETA STREP (GROUP B ONLY)

## 2013-09-03 ENCOUNTER — Inpatient Hospital Stay (HOSPITAL_COMMUNITY)
Admission: RE | Admit: 2013-09-03 | Discharge: 2013-09-06 | DRG: 774 | Disposition: A | Discharge status: Home / Self Care | Payer: Medicaid Other | Source: Ambulatory Visit | Attending: Obstetrics & Gynecology | Admitting: Obstetrics & Gynecology

## 2013-09-03 ENCOUNTER — Encounter (HOSPITAL_COMMUNITY): Payer: Self-pay

## 2013-09-03 DIAGNOSIS — Z22330 Carrier of Group B streptococcus: Secondary | ICD-10-CM

## 2013-09-03 DIAGNOSIS — Z87891 Personal history of nicotine dependence: Secondary | ICD-10-CM

## 2013-09-03 DIAGNOSIS — O139 Gestational [pregnancy-induced] hypertension without significant proteinuria, unspecified trimester: Principal | ICD-10-CM | POA: Diagnosis present

## 2013-09-03 DIAGNOSIS — O99892 Other specified diseases and conditions complicating childbirth: Secondary | ICD-10-CM | POA: Diagnosis present

## 2013-09-03 DIAGNOSIS — H9190 Unspecified hearing loss, unspecified ear: Secondary | ICD-10-CM | POA: Diagnosis present

## 2013-09-03 DIAGNOSIS — O9989 Other specified diseases and conditions complicating pregnancy, childbirth and the puerperium: Secondary | ICD-10-CM

## 2013-09-03 DIAGNOSIS — I959 Hypotension, unspecified: Secondary | ICD-10-CM | POA: Diagnosis present

## 2013-09-03 LAB — COMPREHENSIVE METABOLIC PANEL
ALK PHOS: 122 U/L — AB (ref 39–117)
ALT: 14 U/L (ref 0–35)
AST: 20 U/L (ref 0–37)
Albumin: 2.6 g/dL — ABNORMAL LOW (ref 3.5–5.2)
BUN: 3 mg/dL — ABNORMAL LOW (ref 6–23)
CHLORIDE: 102 meq/L (ref 96–112)
CO2: 26 mEq/L (ref 19–32)
Calcium: 9.1 mg/dL (ref 8.4–10.5)
Creatinine, Ser: 0.43 mg/dL — ABNORMAL LOW (ref 0.50–1.10)
Glucose, Bld: 84 mg/dL (ref 70–99)
POTASSIUM: 3 meq/L — AB (ref 3.7–5.3)
SODIUM: 139 meq/L (ref 137–147)
TOTAL PROTEIN: 5.7 g/dL — AB (ref 6.0–8.3)
Total Bilirubin: 0.4 mg/dL (ref 0.3–1.2)

## 2013-09-03 LAB — RPR: RPR: NONREACTIVE

## 2013-09-03 LAB — CBC
HCT: 31.9 % — ABNORMAL LOW (ref 36.0–46.0)
Hemoglobin: 11 g/dL — ABNORMAL LOW (ref 12.0–15.0)
MCH: 32 pg (ref 26.0–34.0)
MCHC: 34.5 g/dL (ref 30.0–36.0)
MCV: 92.7 fL (ref 78.0–100.0)
PLATELETS: 212 10*3/uL (ref 150–400)
RBC: 3.44 MIL/uL — AB (ref 3.87–5.11)
RDW: 14 % (ref 11.5–15.5)
WBC: 12.5 10*3/uL — AB (ref 4.0–10.5)

## 2013-09-03 LAB — PROTEIN / CREATININE RATIO, URINE
CREATININE, URINE: 23.8 mg/dL
Protein Creatinine Ratio: 0.21 — ABNORMAL HIGH (ref 0.00–0.15)
Total Protein, Urine: 5 mg/dL

## 2013-09-03 LAB — TYPE AND SCREEN
ABO/RH(D): O POS
Antibody Screen: NEGATIVE

## 2013-09-03 MED ORDER — LACTATED RINGERS IV SOLN
INTRAVENOUS | Status: DC
  Administered 2013-09-03 – 2013-09-04 (×4): via INTRAVENOUS

## 2013-09-03 MED ORDER — CITRIC ACID-SODIUM CITRATE 334-500 MG/5ML PO SOLN: 30.0000 mL | ORAL | Status: DC | PRN

## 2013-09-03 MED ORDER — PENICILLIN G POTASSIUM 5000000 UNITS IJ SOLR
5.0000 10*6.[IU] | Freq: Once | INTRAVENOUS | Status: AC
  Administered 2013-09-03: 5 10*6.[IU] via INTRAVENOUS
  Filled 2013-09-03: qty 5

## 2013-09-03 MED ORDER — OXYCODONE-ACETAMINOPHEN 5-325 MG PO TABS: 1.0000 | ORAL_TABLET | ORAL | Status: DC | PRN

## 2013-09-03 MED ORDER — MAGNESIUM SULFATE 40 G IN LACTATED RINGERS - SIMPLE
2.0000 g/h | INTRAVENOUS | Status: DC
  Administered 2013-09-03 – 2013-09-04 (×2): 2 g/h via INTRAVENOUS
  Filled 2013-09-03 (×2): qty 500

## 2013-09-03 MED ORDER — MISOPROSTOL 25 MCG QUARTER TABLET
25.0000 ug | ORAL_TABLET | ORAL | Status: DC | PRN
  Administered 2013-09-03 (×2): 25 ug via VAGINAL
  Filled 2013-09-03 (×2): qty 0.25
  Filled 2013-09-03: qty 1

## 2013-09-03 MED ORDER — OXYTOCIN 40 UNITS IN LACTATED RINGERS INFUSION - SIMPLE MED
62.5000 mL/h | INTRAVENOUS | Status: DC
  Administered 2013-09-04: 62.5 mL/h via INTRAVENOUS

## 2013-09-03 MED ORDER — IBUPROFEN 600 MG PO TABS: 600.0000 mg | ORAL_TABLET | Freq: Four times a day (QID) | ORAL | Status: DC | PRN

## 2013-09-03 MED ORDER — ACETAMINOPHEN 325 MG PO TABS
650.0000 mg | ORAL_TABLET | ORAL | Status: DC | PRN
  Administered 2013-09-04 (×2): 650 mg via ORAL
  Filled 2013-09-03 (×2): qty 2

## 2013-09-03 MED ORDER — LACTATED RINGERS IV SOLN
500.0000 mL | INTRAVENOUS | Status: DC | PRN
  Administered 2013-09-04: 500 mL via INTRAVENOUS

## 2013-09-03 MED ORDER — TERBUTALINE SULFATE 1 MG/ML IJ SOLN: 0.2500 mg | Freq: Once | INTRAMUSCULAR | Status: AC | PRN

## 2013-09-03 MED ORDER — LABETALOL HCL 5 MG/ML IV SOLN
20.0000 mg | INTRAVENOUS | Status: DC | PRN
  Administered 2013-09-03 – 2013-09-04 (×4): 20 mg via INTRAVENOUS
  Filled 2013-09-03 (×4): qty 4

## 2013-09-03 MED ORDER — ONDANSETRON HCL 4 MG/2ML IJ SOLN: 4.0000 mg | Freq: Four times a day (QID) | INTRAMUSCULAR | Status: DC | PRN

## 2013-09-03 MED ORDER — DEXTROSE 5 % IV SOLN
2.5000 10*6.[IU] | INTRAVENOUS | Status: DC
  Administered 2013-09-03 – 2013-09-04 (×6): 2.5 10*6.[IU] via INTRAVENOUS
  Filled 2013-09-03 (×11): qty 2.5

## 2013-09-03 MED ORDER — OXYTOCIN 40 UNITS IN LACTATED RINGERS INFUSION - SIMPLE MED
1.0000 m[IU]/min | INTRAVENOUS | Status: DC
  Administered 2013-09-03: 2 m[IU]/min via INTRAVENOUS
  Filled 2013-09-03: qty 1000

## 2013-09-03 MED ORDER — ZOLPIDEM TARTRATE 5 MG PO TABS
5.0000 mg | ORAL_TABLET | Freq: Every evening | ORAL | Status: DC | PRN
  Administered 2013-09-03: 5 mg via ORAL
  Filled 2013-09-03: qty 1

## 2013-09-03 MED ORDER — BUTORPHANOL TARTRATE 1 MG/ML IJ SOLN
1.0000 mg | INTRAMUSCULAR | Status: DC | PRN
  Administered 2013-09-04: 1 mg via INTRAVENOUS
  Filled 2013-09-03: qty 1

## 2013-09-03 MED ORDER — MAGNESIUM SULFATE BOLUS VIA INFUSION
4.0000 g | Freq: Once | INTRAVENOUS | Status: DC
  Filled 2013-09-03: qty 500

## 2013-09-03 MED ORDER — FLEET ENEMA 7-19 GM/118ML RE ENEM: 1.0000 | ENEMA | Freq: Every day | RECTAL | Status: DC | PRN

## 2013-09-03 MED ORDER — OXYTOCIN BOLUS FROM INFUSION: 500.0000 mL | INTRAVENOUS | Status: DC

## 2013-09-03 NOTE — H&P (Signed)
LABOR ADMISSION HISTORY AND PHYSICAL   Alicia Ortiz is a 33 y.o. female G3P1011 with IUP at [redacted]w[redacted]d by 15week [place] presenting for IOL for GHTN.    Pt states she "feels fine". No HA, vision changes, RUQ pain. Having some irregular contractions.  No LOF, VB,+FM.   [facility] at  [facility] since 14 wks. BP noted to be elevated starting at 34 weeks. 24 hour protein 210mg  on 2/19.  Was scheduled for IOL today due to persistent elevated bps. gbs neg. PRegnancy otherwise uncomplicated.   Prenatal History/Complications:  Past Medical History: Past Medical History  Diagnosis Date  . Deaf     earing aids, reads lips  . Hypertension   . Pregnancy induced hypertension     Past Surgical History: Past Surgical History  Procedure Laterality Date  . Biopsy breast      right breast    Obstetrical History: OB History   Grav Para Term Preterm Abortions TAB SAB Ect Mult Living   3 1 1  0 1 0 1 0 0 1     g1- NSVD.  7lb 9 oz.    Social History: History   Social History  . Marital Status: Married    Spouse Name: N/A    Number of Children: N/A  . Years of Education: N/A   Social History Main Topics  . Smoking status: Former Smoker -- 0.15 packs/day    Quit date: 08/24/2013  . Smokeless tobacco: Never Used  . Alcohol Use: No  . Drug Use: No  . Sexual Activity: Not Currently   Other Topics Concern  . None   Social History Narrative  . None    Family History: Family History  Problem Relation Age of Onset  . Stroke Mother   . Hypertension Mother   . Hyperlipidemia Mother   . Cancer Daughter     leaukemia    Allergies: Allergies  Allergen Reactions  . Sulfa Antibiotics Hives    Facility-administered medications prior to admission  Medication Dose Route Frequency Provider Last Rate Last Dose  . Tdap (BOOSTRIX) injection 0.5 mL  0.5 mL Intramuscular Once Wenda Low, MD       Prescriptions prior to admission  Medication Sig Dispense Refill  . Prenatal Vit-Fe Fumarate-FA  (PRENATAL MULTIVITAMIN) TABS tablet Take 1 tablet by mouth daily at 12 noon.  30 tablet  12     Review of Systems  All systems reviewed and negative except as stated in HPI    Blood pressure 159/85, pulse 89, temperature 98.4 F (36.9 C), temperature source Oral, resp. rate 18, height 5\' 2"  (1.575 m), weight 82.555 kg (182 lb), last menstrual period 01/22/2013, unknown if currently breastfeeding. General appearance: alert, cooperative and no distress Lungs: clear to auscultation bilaterally Heart: regular rate and rhythm Abdomen: soft, non-tender; bowel sounds normal Extremities: Homans sign is negative, no sign of DVT  Presentation: cephalic Fetal monitoringBaseline: 130s bpm, Variability: Good {> 6 bpm), Accelerations: Reactive and Decelerations: Absent Uterine activityNone Dilation: 1 Effacement (%): Thick Station: -2 Exam by:: patti moore rn   Prenatal labs: ABO, Rh: O/POS/-- (09/23 0957) Antibody: NEG (09/23 0957) Rubella:   RPR: NON REAC (12/17 0935)  HBsAg: NEGATIVE (09/23 0957)  HIV: NON REACTIVE (12/17 0935)  GBS: Negative (02/26 0000)  1 hr Glucola 104 Genetic screening  Not done Anatomy US normal    Assessment: Alicia Ortiz is a 33 y.o. G3P1011 with an IUP at [redacted]w[redacted]d presenting for IOL for gHTN. Of note, pt reads lips  as is hearing impaired.   Plan: 1) admit to L&D - routine orders - epidural prn  2) gHTN -  BP elevated here also. One severe range and then elevated - CMP and CBC unremarkable for signs of HELLP - prot/cr ratio sent - will monitor and if BPs remain severe, will start Mag  3) FWB - cat I tracing - GBS neg - EFW 7lb  4) anticipate NSVD   Roseann Kees L, MD 09/03/2013, 9:14 AM

## 2013-09-03 NOTE — Progress Notes (Signed)
Alicia Ortiz is a 33 y.o. G3P1011 at 4968w0d admitted for induction of labor due to Gestational Hypertension.  Subjective: Patient is comfortable.  Would like to eat. Pt comfortable. Denies HA, vision changes, RUQ pain.    Objective: BP 172/84  Pulse 84  Temp(Src) 99 F (37.2 C) (Oral)  Resp 16  Ht 5\' 2"  (1.575 m)  Wt 82.555 kg (182 lb)  BMI 33.28 kg/m2  LMP 01/22/2013 I/O last 3 completed shifts: In: 638.3 [P.O.:480; I.V.:158.3] Out: 400 [Urine:400] Total I/O In: 475 [I.V.:275; IV Piggyback:200] Out: 400 [Urine:400]  FHT:  FHR: 135 bpm, variability: moderate,  accelerations:  Present,  decelerations:  Absent UC:   irregular, every 3-4 minutes SVE:   Dilation: 1.5 Effacement (%): 20 Station: -2 Exam by:: dr Reola Calkinsbeck  Labs: Lab Results  Component Value Date   WBC 12.5* 09/03/2013   HGB 11.0* 09/03/2013   HCT 31.9* 09/03/2013   MCV 92.7 09/03/2013   PLT 212 09/03/2013    Assessment / Plan: 33 yo G3P1011 at 241w1d wks IUP with IOL for Gestational Hypertension, progressed to preeclampsia Allow to eat and then restart pitocin IV  Labor: FB in place. Preeclampsia:  on magnesium sulfate and no signs or symptoms of toxicity Fetal Wellbeing:  Category I Pain Control:  Labor support without medications I/D:  n/a Anticipated MOD:  NSVD  Selena LesserBraimah, Tina 09/03/2013, 9:12 PM  I examined pt and agree with documentation above and nurse midwife student plan of care. [facility],WALIDAH

## 2013-09-03 NOTE — H&P (Signed)
Attestation of Attending Supervision of Obstetric Fellow: Evaluation and management procedures were performed by the Obstetric Fellow under my supervision and collaboration.  I have reviewed the Obstetric Fellow's note and chart, and I agree with the management and plan.  Patrice Moates, MD, FACOG Attending Obstetrician & Gynecologist Faculty Practice, [facility]   

## 2013-09-03 NOTE — Progress Notes (Addendum)
Alicia PotterKimberly N Ortiz is a 33 y.o. G3P1011 at 4263w0d  admitted for induction of labor due to gestational Hypertension.  Subjective:  Pt comfortable. Not feeling much from the cytotec. No HA, vision changes, RUQ pain.  +FM.   Objective: BP 168/75  Pulse 85  Temp(Src) 99 F (37.2 C) (Oral)  Resp 18  Ht 5\' 2"  (1.575 m)  Wt 82.555 kg (182 lb)  BMI 33.28 kg/m2  LMP 01/22/2013      FHT:  FHR: 130s bpm, variability: moderate,  accelerations:  Present,  decelerations:  Absent UC:   Irregular and mild but 2-584min.  SVE:   Dilation: 1 Effacement (%): Thick Station: -2 Exam by:: patti moore rn  Labs: Lab Results  Component Value Date   WBC 12.5* 09/03/2013   HGB 11.0* 09/03/2013   HCT 31.9* 09/03/2013   MCV 92.7 09/03/2013   PLT 212 09/03/2013    Assessment / Plan: IOL for gHTN now with BP in the severe range  Labor: FB placed without difficulty and inflated with 60cc of LR.  gHTN:  will start Mag 4/2 for severe BP.  prot cr ratio 0.21. labs stable. labetalol prn for severe range Fetal Wellbeing:  Category I Pain Control:  Labor support without medications I/D:  GBS + on PCN Anticipated MOD:  NSVD  Sebert Stollings L 09/03/2013, 5:46 PM

## 2013-09-04 ENCOUNTER — Encounter (HOSPITAL_COMMUNITY): Payer: Self-pay

## 2013-09-04 ENCOUNTER — Other Ambulatory Visit: Payer: Medicaid Other

## 2013-09-04 DIAGNOSIS — I959 Hypotension, unspecified: Secondary | ICD-10-CM

## 2013-09-04 DIAGNOSIS — O139 Gestational [pregnancy-induced] hypertension without significant proteinuria, unspecified trimester: Secondary | ICD-10-CM

## 2013-09-04 LAB — CBC
HEMATOCRIT: 29 % — AB (ref 36.0–46.0)
HEMATOCRIT: 25.8 % — AB (ref 36.0–46.0)
HEMATOCRIT: 25.7 % — AB (ref 36.0–46.0)
Hemoglobin: 10 g/dL — ABNORMAL LOW (ref 12.0–15.0)
Hemoglobin: 8.9 g/dL — ABNORMAL LOW (ref 12.0–15.0)
Hemoglobin: 8.9 g/dL — ABNORMAL LOW (ref 12.0–15.0)
MCH: 31.7 pg (ref 26.0–34.0)
MCH: 31.6 pg (ref 26.0–34.0)
MCH: 31.7 pg (ref 26.0–34.0)
MCHC: 34.5 g/dL (ref 30.0–36.0)
MCHC: 34.5 g/dL (ref 30.0–36.0)
MCHC: 34.6 g/dL (ref 30.0–36.0)
MCV: 92.1 fL (ref 78.0–100.0)
MCV: 91.5 fL (ref 78.0–100.0)
MCV: 91.5 fL (ref 78.0–100.0)
PLATELETS: 189 10*3/uL (ref 150–400)
Platelets: 203 10*3/uL (ref 150–400)
Platelets: 203 10*3/uL (ref 150–400)
RBC: 3.15 MIL/uL — AB (ref 3.87–5.11)
RBC: 2.82 MIL/uL — AB (ref 3.87–5.11)
RBC: 2.81 MIL/uL — ABNORMAL LOW (ref 3.87–5.11)
RDW: 13.8 % (ref 11.5–15.5)
RDW: 13.5 % (ref 11.5–15.5)
RDW: 13.8 % (ref 11.5–15.5)
WBC: 20.4 10*3/uL — ABNORMAL HIGH (ref 4.0–10.5)
WBC: 20.1 10*3/uL — AB (ref 4.0–10.5)
WBC: 21.5 10*3/uL — ABNORMAL HIGH (ref 4.0–10.5)

## 2013-09-04 MED ORDER — ONDANSETRON HCL 4 MG/2ML IJ SOLN: 4.0000 mg | INTRAMUSCULAR | Status: DC | PRN

## 2013-09-04 MED ORDER — MISOPROSTOL 200 MCG PO TABS
800.0000 ug | ORAL_TABLET | Freq: Once | ORAL | Status: AC
  Administered 2013-09-04: 800 ug via RECTAL
  Filled 2013-09-04: qty 4

## 2013-09-04 MED ORDER — LANOLIN HYDROUS EX OINT
TOPICAL_OINTMENT | CUTANEOUS | Status: DC | PRN
Start: 2013-09-04 — End: 2013-09-06

## 2013-09-04 MED ORDER — SIMETHICONE 80 MG PO CHEW: 80.0000 mg | CHEWABLE_TABLET | ORAL | Status: DC | PRN

## 2013-09-04 MED ORDER — PRENATAL MULTIVITAMIN CH
1.0000 | ORAL_TABLET | Freq: Every day | ORAL | Status: DC
  Administered 2013-09-05: 1 via ORAL
  Filled 2013-09-04: qty 1

## 2013-09-04 MED ORDER — ONDANSETRON HCL 4 MG PO TABS: 4.0000 mg | ORAL_TABLET | ORAL | Status: DC | PRN

## 2013-09-04 MED ORDER — MAGNESIUM SULFATE 40 G IN LACTATED RINGERS - SIMPLE: 2.0000 g/h | INTRAVENOUS | Status: DC

## 2013-09-04 MED ORDER — DIBUCAINE 1 % RE OINT: 1.0000 "application " | TOPICAL_OINTMENT | RECTAL | Status: DC | PRN

## 2013-09-04 MED ORDER — IBUPROFEN 600 MG PO TABS
600.0000 mg | ORAL_TABLET | Freq: Four times a day (QID) | ORAL | Status: DC
  Administered 2013-09-04 – 2013-09-06 (×6): 600 mg via ORAL
  Filled 2013-09-04 (×6): qty 1

## 2013-09-04 MED ORDER — WITCH HAZEL-GLYCERIN EX PADS: 1.0000 "application " | MEDICATED_PAD | CUTANEOUS | Status: DC | PRN

## 2013-09-04 MED ORDER — TETANUS-DIPHTH-ACELL PERTUSSIS 5-2.5-18.5 LF-MCG/0.5 IM SUSP: 0.5000 mL | Freq: Once | INTRAMUSCULAR | Status: DC

## 2013-09-04 MED ORDER — LIDOCAINE HCL (PF) 1 % IJ SOLN
INTRAMUSCULAR | Status: AC
  Filled 2013-09-04: qty 30

## 2013-09-04 MED ORDER — ZOLPIDEM TARTRATE 5 MG PO TABS
5.0000 mg | ORAL_TABLET | Freq: Every evening | ORAL | Status: DC | PRN
Start: 2013-09-04 — End: 2013-09-06

## 2013-09-04 MED ORDER — OXYTOCIN 40 UNITS IN LACTATED RINGERS INFUSION - SIMPLE MED
62.5000 mL/h | INTRAVENOUS | Status: DC
  Administered 2013-09-04: 62.5 mL/h via INTRAVENOUS
  Filled 2013-09-04: qty 1000

## 2013-09-04 MED ORDER — BENZOCAINE-MENTHOL 20-0.5 % EX AERO: 1.0000 "application " | INHALATION_SPRAY | CUTANEOUS | Status: DC | PRN

## 2013-09-04 MED ORDER — OXYCODONE-ACETAMINOPHEN 5-325 MG PO TABS: 1.0000 | ORAL_TABLET | ORAL | Status: DC | PRN

## 2013-09-04 MED ORDER — SENNOSIDES-DOCUSATE SODIUM 8.6-50 MG PO TABS
2.0000 | ORAL_TABLET | ORAL | Status: DC
  Administered 2013-09-04 – 2013-09-05 (×2): 2 via ORAL
  Filled 2013-09-04 (×2): qty 2

## 2013-09-04 MED ORDER — DIPHENHYDRAMINE HCL 25 MG PO CAPS: 25.0000 mg | ORAL_CAPSULE | Freq: Four times a day (QID) | ORAL | Status: DC | PRN

## 2013-09-04 NOTE — Progress Notes (Signed)
Adline PotterKimberly N Kiddy is a 33 y.o. G3P1011 at 6336w1d  admitted for induction of labor due to Gestational Hypertension.  Subjective: Patient is comfortable. Expresses concern about repeated trips to bathroom.  Objective: BP 158/90  Pulse 80  Temp(Src) 98.4 F (36.9 C) (Oral)  Resp 16  Ht 5\' 2"  (1.575 m)  Wt 82.555 kg (182 lb)  BMI 33.28 kg/m2  LMP 01/22/2013 I/O last 3 completed shifts: In: 638.3 [P.O.:480; I.V.:158.3] Out: 400 [Urine:400] Total I/O In: 2040.2 [P.O.:720; I.V.:1020.2; IV Piggyback:300] Out: 2950 [Urine:2950]  FHT:  FHR: 130s bpm, variability: moderate,  accelerations:  Present,  decelerations:  Absent UC:   Sporadic    SVE:   Foley bulb out  Labs: Lab Results  Component Value Date   WBC 12.5* 09/03/2013   HGB 11.0* 09/03/2013   HCT 31.9* 09/03/2013   MCV 92.7 09/03/2013   PLT 212 09/03/2013    Assessment / Plan: Induction of labor due to gestational hypertension,  progressing well on pitocin.  Continue to increase Pitocin until contractions are adequate.  Labor: Progressing on Pitocin Preeclampsia:  on magnesium sulfate and no signs or symptoms of toxicity Fetal Wellbeing:  Category I Pain Control:  Labor support without medications I/D:  n/a Anticipated MOD:  NSVD  Selena LesserBraimah, Tina 09/04/2013, 3:16 AM  I examined pt and agree with documentation above and nurse midwife student plan of care. [facility],Aaden Buckman

## 2013-09-04 NOTE — Progress Notes (Signed)
Results for Alicia Ortiz, Alicia Ortiz (MRN [id number]) as of 09/04/2013 21:47  Ref. Range 09/04/2013 21:05  WBC Latest Range: 4.0-10.5 K/uL 21.5 (H)  RBC Latest Range: 3.87-5.11 MIL/uL 2.81 (L)  Hemoglobin Latest Range: 12.0-15.0 g/dL 8.9 (L)  HCT Latest Range: 36.0-46.0 % 25.7 (L)  MCV Latest Range: 78.0-100.0 fL 91.5  MCH Latest Range: 26.0-34.0 pg 31.7  MCHC Latest Range: 30.0-36.0 g/dL 78.234.6  RDW Latest Range: 11.5-15.5 % 13.8  Platelets Latest Range: 150-400 K/uL 203  Report to Dr. Reola CalkinsBeck

## 2013-09-04 NOTE — Progress Notes (Signed)
   Subjective: Pt reports comfortable.  Denies headache, epigastric pain, or vision changes.    Objective: BP 140/68  Pulse 86  Temp(Src) 98.5 F (36.9 C) (Oral)  Resp 16  Ht 5\' 2"  (1.575 m)  Wt 82.555 kg (182 lb)  BMI 33.28 kg/m2  LMP 01/22/2013 I/O last 3 completed shifts: In: 638.3 [P.O.:480; I.V.:158.3] Out: 400 [Urine:400] Total I/O In: 1097.2 [P.O.:240; I.V.:657.2; IV Piggyback:200] Out: 2000 [Urine:2000]  FHT:  FHR: 120's bpm, variability: moderate,  accelerations:  Present,  decelerations:  Absent UC:   irregular, every 2-8 minutes SVE:   Dilation: 1.5 Effacement (%): 20 Station: -2 Exam by:: dr Reola Calkinsbeck when foley bulb placed. Foley bulb still in place.   Labs: Lab Results  Component Value Date   WBC 12.5* 09/03/2013   HGB 11.0* 09/03/2013   HCT 31.9* 09/03/2013   MCV 92.7 09/03/2013   PLT 212 09/03/2013    Assessment / Plan: Augmentation of Labor Preeclampsia, stable  Labor: Progressing normally; restart pitocin Preeclampsia:  no signs or symptoms of toxicity Fetal Wellbeing:  Category I Pain Control:  Labor support without medications I/D:  GBS positive Anticipated MOD:  NSVD  [facility],Alicia Gritz 09/04/2013, 12:40 AM

## 2013-09-04 NOTE — Progress Notes (Signed)
Cumulative EBL ~ 1650 after passing ~400cc clot with fundal massage.  Pt became hypotensive, pale and lethargic. Requested  Dr. Dolan AmenHarroway-Smith to come  examine pt.  No further clots removed. Fundus firm at UE. Stat CBC drawn. 2nd PIV started. 800mcg Cytotec given earlier at 1552 per rectum. Will continue to watch closely and notify MD of changes.

## 2013-09-04 NOTE — Progress Notes (Addendum)
Alicia PotterKimberly N Ortiz is a 33 y.o. G3P1011 at 2860w1d  admitted for induction of labor due to Gestational Hypertension.  Subjective: Patient feeling contractions but they don't really hurt.  States that she's fine.  Objective: BP 154/80  Pulse 82  Temp(Src) 98.4 F (36.9 C) (Oral)  Resp 16  Ht 5\' 2"  (1.575 m)  Wt 82.555 kg (182 lb)  BMI 33.28 kg/m2  LMP 01/22/2013 I/O last 3 completed shifts: In: 638.3 [P.O.:480; I.V.:158.3] Out: 400 [Urine:400] Total I/O In: 3271.2 [P.O.:1440; I.V.:1431.2; IV Piggyback:400] Out: 4100 [Urine:4100]  FHT:  FHR: 130s bpm, variability: moderate,  accelerations:  Present,  decelerations:  Absent UC:   Sporadic    SVE:   5/50/-2  Labs: Lab Results  Component Value Date   WBC 12.5* 09/03/2013   HGB 11.0* 09/03/2013   HCT 31.9* 09/03/2013   MCV 92.7 09/03/2013   PLT 212 09/03/2013    Assessment / Plan: Induction of labor due to gestational hypertension,  progressing well on pitocin.   Progressed to preeclampsia Continue to increase Pitocin until contractions are adequate.  Labor: Progressing on Pitocin Preeclampsia:  on magnesium sulfate and no signs or symptoms of toxicity Fetal Wellbeing:  Category I Pain Control:  Labor support without medications I/D:  n/a Anticipated MOD:  NSVD  Selena LesserBraimah, Tina 09/04/2013, 6:43 AM  I examined pt and agree with documentation above and nurse midwife student plan of care. Selena LesserBraimah, Tina  I examined pt and agree with documentation above and nurse midwife student plan of care. [facility],WALIDAH

## 2013-09-04 NOTE — Progress Notes (Signed)
Patient ID: Alicia Ortiz, female   DOB: Apr 10, 1981, 33 y.o.   MRN: [id number]  Alicia Ortiz is a 33 y.o. G3P1011 at 5480w1d [facility]  Subjective: Received Stadol x1 and Labetalol 20 mg IVP for 2 SBPs >170. No preE sx.   Objective: BP 139/69  Pulse 70  Temp(Src) 98 F (36.7 C) (Oral)  Resp 20  Ht 5\' 2"  (1.575 m)  Wt 82.555 kg (182 lb)  BMI 33.28 kg/m2  LMP 01/22/2013  Fetal Heart FHR: 120 bpm, variability: moderate,  accelerations:  Present,  decelerations:  Present repetitive variables 80-90s with prompt return> IUPC, IFSE place   Contractions: q2-3  SVE:   Dilation: 5.5 Effacement (%): 90 Station: -2 Exam by:: D. Lillyahna Hemberger CNM  Assessment / Plan:  Labor: Active Fetal Wellbeing: Cat2 Pain Control:  IV analgesics, declines epidural Expected mode of delivery: NSVD  Alicia Ortiz 09/04/2013, 11:53 AM

## 2013-09-04 NOTE — Progress Notes (Signed)
Patient ID: Alicia PotterKimberly N Ortiz, female   DOB: 07/10/1980, 33 y.o.   MRN: [id number] Alicia Ortiz is a 33 y.o. G3P1011 at 6447w1d admitted for IOL indicated by [facility].   Subjective: Comfortable, aware of UCs.  No H/A, visual disturbance.  Objective: BP 150/75  Pulse 81  Temp(Src) 98 F (36.7 C) (Oral)  Resp 22  Ht 5\' 2"  (1.575 m)  Wt 82.555 kg (182 lb)  BMI 33.28 kg/m2  LMP 01/22/2013 Filed Vitals:   09/04/13 0835 09/04/13 0902 09/04/13 1000 09/04/13 1030  BP: 154/77 165/81 137/65 150/75  Pulse: 91 87 78 81  Temp:      TempSrc:      Resp:  20 22   Height:      Weight:       LAB: CBC, CMP WNL, P;C ratio .21 UOP -590 yesterday, now +18  Fetal Heart FHR: 120-125 bpm, variability: moderate,  accelerations:  Present,  decelerations:  Absent   Contractions: q2-584min, 30-40sec, pitocin at 16 mu  SVE:   Dilation: 5 Effacement (%): 70 Station: -3;-2 Exam by:: D. Laurieanne Galloway CNM Bulging membranes;  AROM @1045 > clear AF  Assessment / Plan:  G3P1011 at 6747w1d undergoing IOL for GHTN, on MgSO4 at 2Gm/hr Labor: IOL, early labor Fetal Wellbeing: Category1 Pain Control:  n/a Expected mode of delivery: NSVD  Alicia Ortiz 09/04/2013, 10:43 AM

## 2013-09-05 ENCOUNTER — Encounter (HOSPITAL_COMMUNITY)
Admission: RE | Disposition: A | Discharge status: Home / Self Care | Payer: Self-pay | Source: Ambulatory Visit | Attending: Obstetrics & Gynecology

## 2013-09-05 LAB — CBC
HCT: 22.9 % — ABNORMAL LOW (ref 36.0–46.0)
HEMOGLOBIN: 8 g/dL — AB (ref 12.0–15.0)
MCH: 32 pg (ref 26.0–34.0)
MCHC: 34.9 g/dL (ref 30.0–36.0)
MCV: 91.6 fL (ref 78.0–100.0)
Platelets: 191 10*3/uL (ref 150–400)
RBC: 2.5 MIL/uL — ABNORMAL LOW (ref 3.87–5.11)
RDW: 13.8 % (ref 11.5–15.5)
WBC: 19.9 10*3/uL — ABNORMAL HIGH (ref 4.0–10.5)

## 2013-09-05 SURGERY — LIGATION, FALLOPIAN TUBE, POSTPARTUM
Anesthesia: Spinal | Laterality: Bilateral

## 2013-09-05 MED ORDER — LACTATED RINGERS IV SOLN
INTRAVENOUS | Status: DC
  Administered 2013-09-05: 09:00:00 via INTRAVENOUS

## 2013-09-05 NOTE — Progress Notes (Signed)
Post Partum Day 1 Subjective: no complaints and tolerating PO Pt denies dizziness or SOB.  Denies visual changes.  Reports that her bleeding is light ('less than a period').  She has decided against a PP BTL but, wants to consider an interval tubal.  On magnesium sulfate for severe range BP's with normal Pr/Cr ratio Objective: Blood pressure 120/61, pulse 80, temperature 99.4 F (37.4 C), temperature source Oral, resp. rate 20, height 5\' 2"  (1.575 m), weight 182 lb (82.555 kg), last menstrual period 01/22/2013, SpO2 100.00%, unknown if currently breastfeeding.  Physical Exam:  General: alert and no distress Lochia: appropriate Uterine Fundus: firm DVT Evaluation: No evidence of DVT seen on physical exam. Edema  Noted of hands and face.  Urine output ~50cc-100cc/hour  Recent Labs  09/04/13 2105 09/05/13 0510  HGB 8.9* 8.0*  HCT 25.7* 22.9*    Assessment/Plan: Pt s/p PP hemorrhage- stable at present Contraception OCP's Infant in NICU discontinue magnesium at 24 hours. Discussed with pt possibility of blood transfusion.  She reports that she does not feel the need at present   LOS: 2 days   Alicia Ortiz, Alicia Ortiz 09/05/2013, 7:37 AM

## 2013-09-05 NOTE — Progress Notes (Signed)
UR chart review completed.  

## 2013-09-05 NOTE — Lactation Note (Signed)
This note was copied from the chart of Alicia Gerlene FeeKimberly Rosamilia. Lactation Consultation Note     Initial consult with this mom of a NICU baby, now 24 hours post aprtum, and baby is 37 2/7 weeks corrected gestation. Mom did not want to pump while on magnesium drip, so was started pumping today. Mom di not want to me to do hand expression - she said it was too painful. i explained that she should try,since it will increase her milk supply. With pumping, mom was able to express about 2mls of colostrum. Teaching one from NICU booklet. Mom knows to call for questions/concerns.  Patient Name: Alicia Ortiz ZOXWR'UToday's Date: 09/05/2013     Maternal Data    Feeding Feeding Type: Formula Nipple Type: Slow - flow Length of feed: 30 min  LATCH Score/Interventions                      Lactation Tools Discussed/Used     Consult Status      Alicia Ortiz, Alicia Ortiz 09/05/2013, 6:24 PM

## 2013-09-06 ENCOUNTER — Ambulatory Visit: Payer: Self-pay

## 2013-09-06 MED ORDER — IBUPROFEN 600 MG PO TABS: 600.0000 mg | ORAL_TABLET | Freq: Four times a day (QID) | ORAL | Status: DC

## 2013-09-06 NOTE — Discharge Instructions (Signed)

## 2013-09-06 NOTE — Discharge Summary (Signed)
Obstetric Discharge Summary Reason for Admission: induction of labor and GHTN Prenatal Procedures: none Intrapartum Procedures: spontaneous vaginal delivery and GBS prophylaxis Postpartum Procedures: none Complications-Operative and Postpartum: hemorrhage and HTN Hemoglobin  Date Value Ref Range Status  09/05/2013 8.0* 12.0 - 15.0 g/dL Final  4/09/81199/23/2014 14.712.0   Final     HCT  Date Value Ref Range Status  09/05/2013 22.9* 36.0 - 46.0 % Final  03/28/2013 35   Final    Discharge Diagnoses: Term Pregnancy-delivered  Hospital Course:  Adline PotterKimberly N Kue is a 33 y.o. [id number] who presented with IOL for GHTN.  She had a uncomplicated SVD. She had a postpartum hemorrhage, and was not transfused as she remained asymptomatic. Her hgb stabilized and was 8.0 on 09/05/13. She was able to ambulate, tolerate PO and void normally. Her BPs remained elevated postpartum, which she reports is similar to her previous pregnancy in which her BP returned to normal in 2-3 weeks. She was discharged home with instructions for postpartum care, and to [facility] for BP monitoring and discussion off BTL.   Delivery Note  At 12:29 PM a viable female was delivered via (Presentation:OA>LOA). APGAR 8@1  min, 9@5min : , ; weight pending.  Placenta status: spontaneous, intact, . Cord: 3V with the following complications:none .  Anesthesia: none  Episiotomy: none  Lacerations: none  Suture Repair: n/a  Est. Blood Loss (mL): 300  Filed Vitals:    09/04/13 1101  09/04/13 1121  09/04/13 1132  09/04/13 1200   BP:   165/81  139/69  150/79   Pulse:   76  70  82   Temp:     98.4 F (36.9 C)   TempSrc:     Axillary   Resp:  20    18   Height:       Weight:        Mom to AICU for 24 hr MgSO4. Baby to NICU  Physical Exam:  General: alert, cooperative and no distress Lochia: appropriate Uterine Fundus: firm DVT Evaluation: No evidence of DVT seen on physical exam. No cords or calf tenderness. Calf/Ankle edema  is present.  Discharge Information: Date: 09/06/2013 Activity: pelvic rest Diet: routine Medications: Ibuprofen Baby feeding: plans to breastfeed, supplementing with formula Contraception: abstinence, bilateral tubal ligation Condition: stable Instructions: refer to practice specific booklet Discharge to: home  Newborn Data: Live born female  Birth Weight: 6 lb 8.1 oz (2950 g) APGAR: 8, 9  Baby in NICU   Wenda LowJames Joyner, MD [facility] PGY[facility]1 09/06/2013, 9:03 AM   I have seen this patient and agree with the above resident's note.  Pt has appt in clinic tomorrow to evaluated BP.    LEFTWICH-KIRBY, LISA Certified Nurse-Midwife

## 2013-09-06 NOTE — Lactation Note (Addendum)
This note was copied from the chart of Alicia Gerlene FeeKimberly Kostelnik. Lactation Consultation Note      Follow up consult with this mom of a NICU baby, now 46 hours post partum, and 37 3/7 weeks corrected gestation. Mom is being discharged to home today, [facility]. Mom will be back to latch baby at 1 pm today, and I will assist her with latching/breast feeding. Mom has stated yesterday she wanted to pump and bottle feed, but seems interested in trying to latch today. Mom still is not adding hand expression, so I encouraged her to do so.  Mom came back at 1 pm, but the baby had already started feeding form the bottle, so mom finished bottle feeding. She has her pump parts, and will pump while here, [facility]. Mom has decided to breast feed her baby. I will meet with mom on Friday to assist with latching, and do follow up with o/p consults.   Patient Name: Alicia Ortiz Reason for consult: Follow-up assessment;NICU baby;Late preterm infant   Maternal Data    Feeding Feeding Type: Formula Nipple Type: Regular Length of feed: 30 min  LATCH Score/Interventions                      Lactation Tools Discussed/Used WIC Program: Yes (mom going to get a DEP today)   Consult Status Consult Status: Follow-up Date: 09/06/13 Follow-up type: In-patient (1 pm to help with latch)    [name], [name] Anne Ortiz, 10:51 AM

## 2013-09-06 NOTE — Progress Notes (Addendum)
Pt discharged home with husband... Condition stable... No equipment... Ambulated to car with V. Pittman, NT.  

## 2013-09-07 ENCOUNTER — Ambulatory Visit: Payer: Medicaid Other

## 2013-09-07 ENCOUNTER — Other Ambulatory Visit: Payer: Medicaid Other

## 2013-09-07 ENCOUNTER — Ambulatory Visit (HOSPITAL_COMMUNITY): Payer: Medicaid Other | Attending: Family Medicine

## 2013-09-07 VITALS — BP 158/74 | HR 88 | Temp 98.2°F

## 2013-09-07 DIAGNOSIS — O1002 Pre-existing essential hypertension complicating childbirth: Secondary | ICD-10-CM

## 2013-09-07 MED ORDER — HYDROCHLOROTHIAZIDE 25 MG PO TABS: 25.0000 mg | ORAL_TABLET | Freq: Every day | ORAL | Status: DC

## 2013-09-07 NOTE — Progress Notes (Signed)
Pt. Here today for BP check. Pt. Had been discharged yesterday after having SVD with GHTN. BP today 158/74 manually. Pt. Denies headache, blurred vision, abdominal pain and SOB. Discussed case with Dr. Jolayne Pantheronstant who wishes to start pt. On HCTZ 25mg  daily and to come back for BP check in one week. Pt. Informed and advised to come to call clinic with concerns or come to MAU with any symptoms of headache, blurred vision, abdominal pain, SOB, dizziness or lightheadedness. Pt. Advised to rise from laying and sitting positions slowly. Pt verbalized understanding and had no other questions or concerns.

## 2013-09-11 ENCOUNTER — Other Ambulatory Visit: Payer: Medicaid Other

## 2013-09-11 ENCOUNTER — Ambulatory Visit: Payer: Self-pay

## 2013-09-11 NOTE — Lactation Note (Signed)
This note was copied from the chart of Girl Alicia Ortiz Papin. Lactation Consultation Note    Follow up consult with this mom and baby, in the NICU. I assisted mom with latching baby to breast for the first time. Mom has flat nipples, and Lowella Bandyikki would not stay latched. I fitted mom with a 20 nipple shield, and Nikki latched easily, strong suckles and milk seen in the shield. Pre and post weight done - baby transferred   4   ml's . and was fed after with a bottle of EBM. Mom was taught how to apply nipple shield. Mom was encoaged to continue trying to latch ArpelarNikki  after discharge, about 2-3 imes a day, offer pc each time, and to call at her convenience for an outpt lactation consult.   Patient Name: Girl Alicia Ortiz Ganson BJYNW'GToday's Date: 09/11/2013 Reason for consult: Follow-up assessment;NICU baby   Maternal Data    Feeding Feeding Type: Breast Fed Nipple Type: Regular Length of feed: 30 min  LATCH Score/Interventions Latch: Repeated attempts needed to sustain latch, nipple held in mouth throughout feeding, stimulation needed to elicit sucking reflex. (mom has FN's baby latached well with 20 nipple shiled) Intervention(s): Assist with latch;Adjust position;Breast compression  Audible Swallowing: A few with stimulation Intervention(s): Skin to skin;Hand expression  Type of Nipple: Flat  Comfort (Breast/Nipple): Soft / non-tender     Hold (Positioning): Assistance needed to correctly position infant at breast and maintain latch. Intervention(s): Breastfeeding basics reviewed;Support Pillows;Position options;Skin to skin  LATCH Score: 6  Lactation Tools Discussed/Used Tools: Nipple Shields Nipple shield size: 20   Consult Status Consult Status: Follow-up Follow-up type: Call as needed    [name], Kena Limon Anne 09/11/2013, 11:53 AM

## 2013-09-14 ENCOUNTER — Other Ambulatory Visit: Payer: Medicaid Other

## 2013-09-14 ENCOUNTER — Ambulatory Visit: Payer: Medicaid Other

## 2013-09-14 VITALS — BP 152/92 | HR 75 | Temp 98.2°F

## 2013-09-14 DIAGNOSIS — O1002 Pre-existing essential hypertension complicating childbirth: Secondary | ICD-10-CM

## 2013-09-14 MED ORDER — HYDROCHLOROTHIAZIDE 25 MG PO TABS: 25.0000 mg | ORAL_TABLET | Freq: Every day | ORAL | Status: DC

## 2013-09-14 NOTE — Progress Notes (Signed)
Pt. Here this morning for BP check after starting HCTZ last week. Pt. Is one week post partum. BP manually 152/92. Pt. Denies headache, SOB, dizziness, abdominal pain and  Pt. States she is taking the medication daily without fail but admits to smoking and eating a poor diet rich in salt because her husband cooks with it. Consulted Dr. Debroah LoopArnold who does not wish to increase medication at this time, states give the medication more time to work and have pt. Return for PP visit on April 9th. Dr. Debroah LoopArnold agreed to refill medication as pt. Will need another weeks worth of pills to make it to appointment. Informed pt. Of this and advised her to use salt in moderation, do not add extra salt at the dinner table and monitor her intake. Pt. States she does not like salt but she cant change her husband.  Advised pt. To call or return to MAU if she does develop severe headache, SOB, dizziness, or abdominal pain as these are signs of high blood pressure. Pt. Verbalized understanding and had no questions or concerns.

## 2013-09-18 ENCOUNTER — Other Ambulatory Visit: Payer: Self-pay | Admitting: Obstetrics and Gynecology

## 2013-09-18 MED ORDER — AMLODIPINE BESYLATE 5 MG PO TABS: 5.0000 mg | ORAL_TABLET | Freq: Every day | ORAL | Status: DC

## 2013-09-18 MED ORDER — FERROUS SULFATE 325 (65 FE) MG PO TABS: 325.0000 mg | ORAL_TABLET | Freq: Two times a day (BID) | ORAL | Status: DC

## 2013-09-18 MED ORDER — IBUPROFEN 600 MG PO TABS: 600.0000 mg | ORAL_TABLET | Freq: Four times a day (QID) | ORAL | Status: DC

## 2013-10-12 ENCOUNTER — Ambulatory Visit: Payer: Medicaid Other | Admitting: Family Medicine

## 2013-10-12 ENCOUNTER — Ambulatory Visit: Payer: Medicaid Other | Admitting: Obstetrics & Gynecology

## 2013-11-20 ENCOUNTER — Ambulatory Visit: Payer: Medicaid Other | Admitting: Obstetrics & Gynecology

## 2013-11-20 ENCOUNTER — Other Ambulatory Visit: Payer: Self-pay | Admitting: Obstetrics and Gynecology

## 2013-11-20 ENCOUNTER — Telehealth: Payer: Self-pay

## 2013-11-20 NOTE — Telephone Encounter (Signed)
Patient missed today's appointment for PP visit. Pt is 11 weeks out PP today. Attempted to call pt. No answer. Left message stating "We are sorry you missed your appointment here with us please call clinic to reschedule."

## 2013-11-29 ENCOUNTER — Encounter: Payer: Self-pay | Admitting: *Deleted

## 2014-05-07 ENCOUNTER — Encounter (HOSPITAL_COMMUNITY): Payer: Self-pay

## 2014-06-06 ENCOUNTER — Encounter: Payer: Self-pay | Admitting: Obstetrics & Gynecology

## 2014-08-27 IMAGING — US US OB FOLLOW-UP
1 series · 12 of 28 positions shown · non-contrast
Comparison: none

[Series 1: us ob follow up · 12 of 51 slices shown]
[im 2/51]
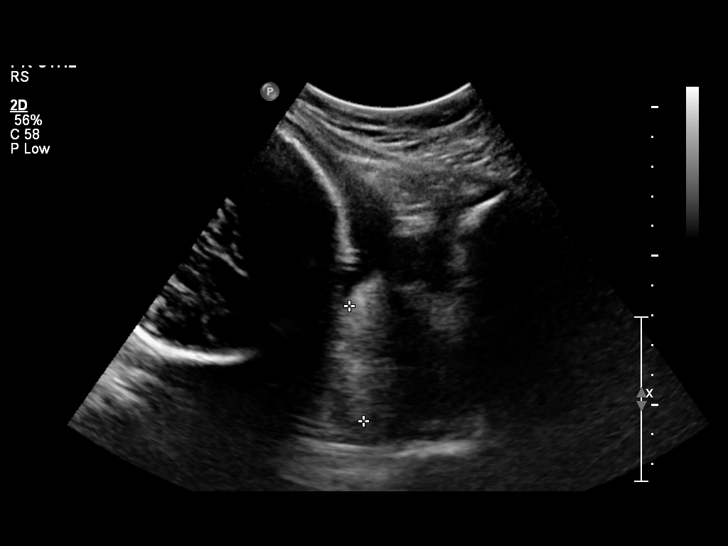
[im 6/51]
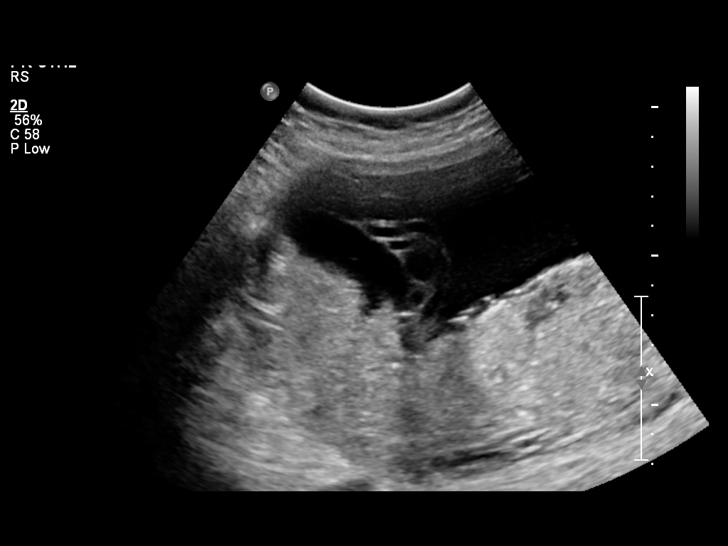
[im 10/51]
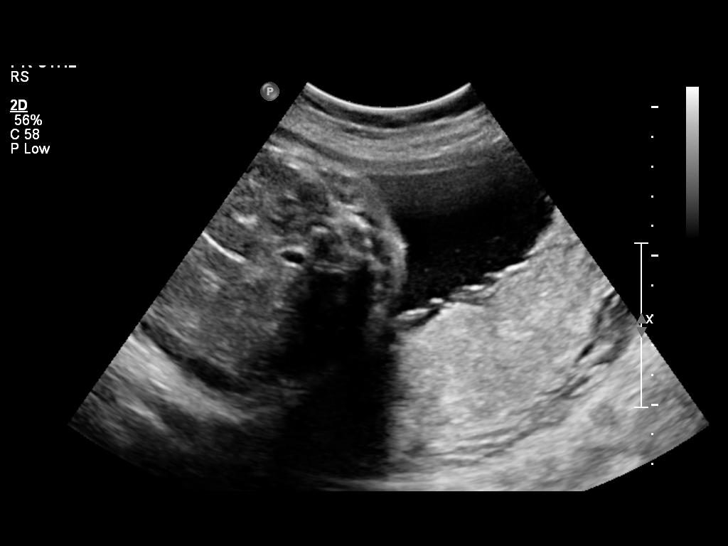
[im 15/51]
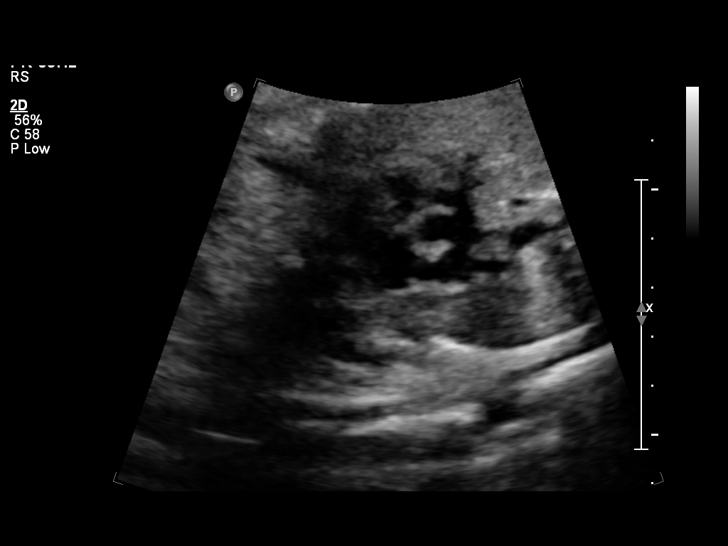
[im 19/51]
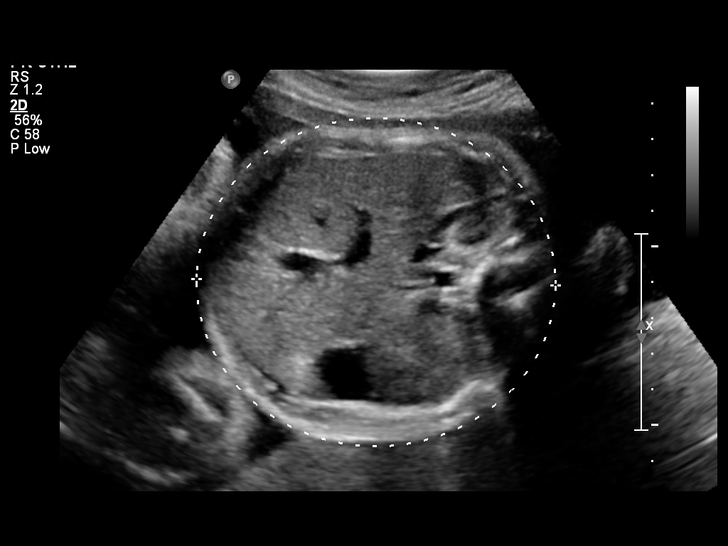
[im 23/51]
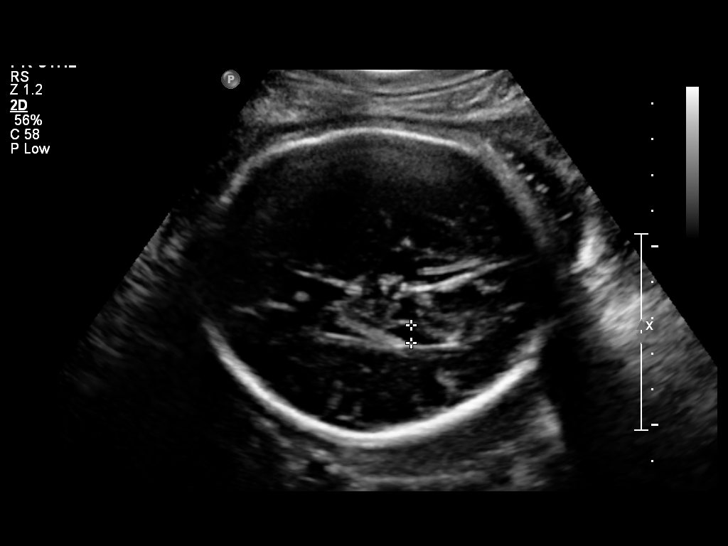
[im 28/51]
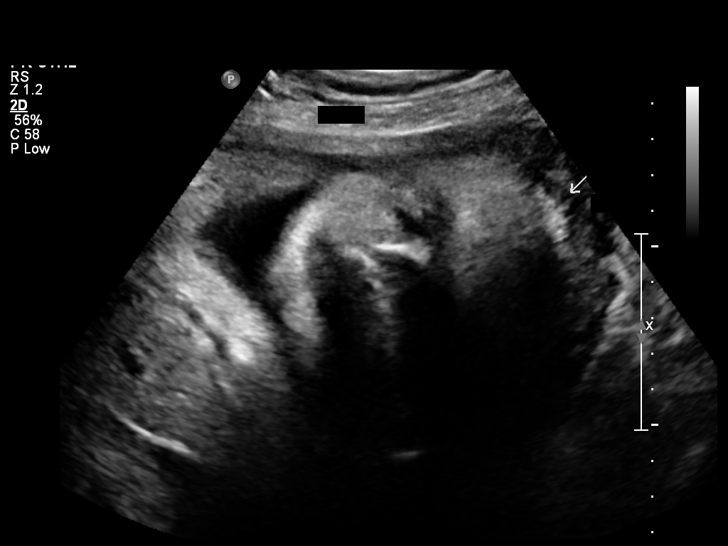
[im 32/51]
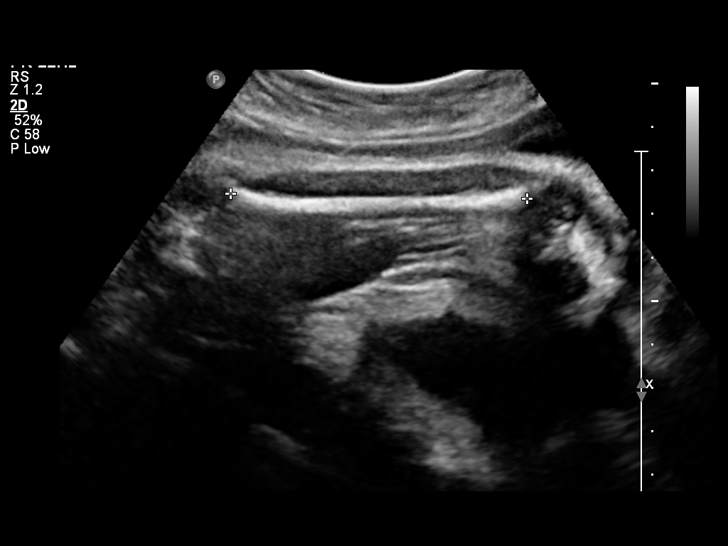
[im 36/51]
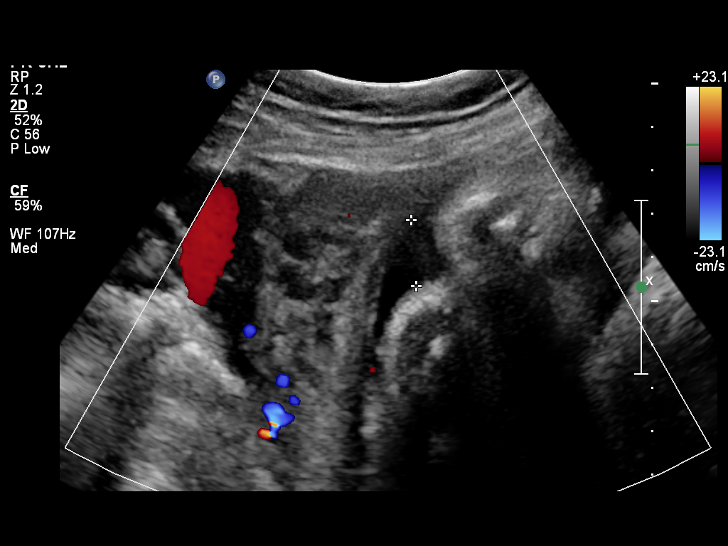
[im 41/51]
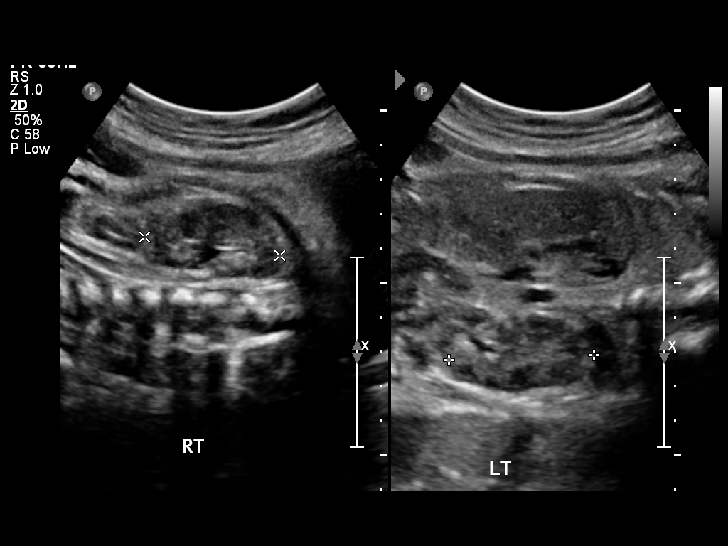
[im 45/51]
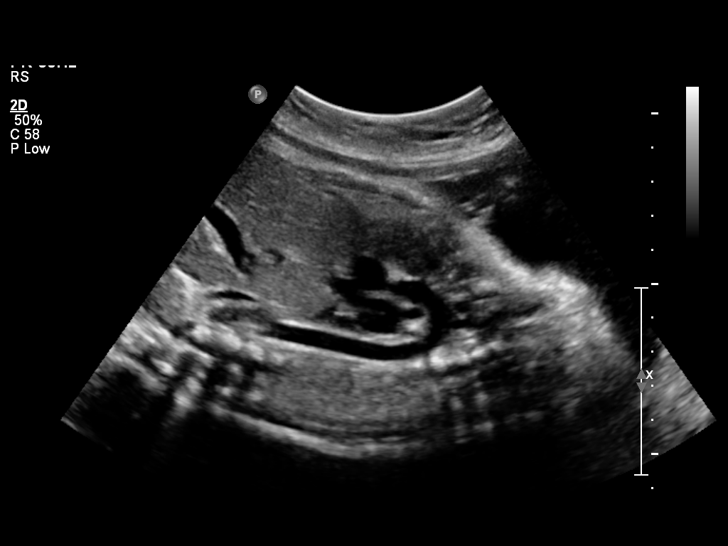
[im 49/51]
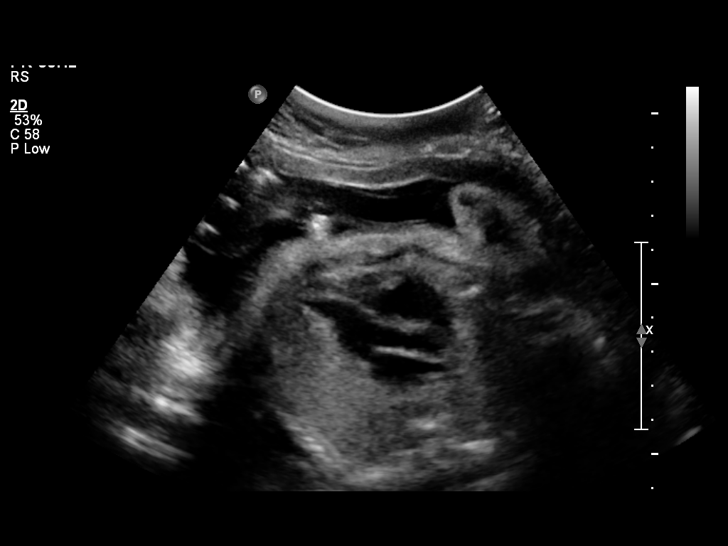

[12 of 28 positions shown; findings below may reference images not displayed]

OBSTETRICS REPORT
                      (Signed Final 08/10/2013 [DATE])

Service(s) Provided

 US OB FOLLOW UP                                       76816.1
Indications

 Follow-up incomplete fetal anatomic evaluation
 Hypertension - Gestational
Fetal Evaluation

 Num Of Fetuses:    1
 Fetal Heart Rate:  134                          bpm
 Cardiac Activity:  Observed
 Presentation:      Cephalic
 Placenta:          Posterior, above cervical
                    os
 P. Cord            Visualized, central
 Insertion:

 Amniotic Fluid
 AFI FV:      Subjectively within normal limits
 AFI Sum:     12.68   cm       38  %Tile     Larg Pckt:    4.65  cm
 RUQ:   4.23    cm   RLQ:    1.52   cm    LUQ:   4.65    cm   LLQ:    2.28   cm
Biometry

 BPD:     81.4  mm     G. Age:  32w 5d                CI:        72.74   70 - 86
                                                      FL/HC:      21.9   19.4 -

 HC:     303.5  mm     G. Age:  33w 5d       20  %    HC/AC:      1.00   0.96 -

 AC:     303.9  mm     G. Age:  34w 3d       75  %    FL/BPD:     81.6   71 - 87
 FL:      66.4  mm     G. Age:  34w 1d       56  %    FL/AC:      21.8   20 - 24
 HUM:     55.8  mm     G. Age:  32w 3d       35  %

 Est. FW:    3828  gm      5 lb 3 oz     69  %
Gestational Age

 LMP:           28w 4d        Date:  01/22/13                 EDD:   10/29/13
 U/S Today:     33w 5d                                        EDD:   09/23/13
 Best:          33w 4d     Det. By:  U/S (04/04/13)           EDD:   09/24/13
Anatomy
 Cranium:          Appears normal         Aortic Arch:      Appears normal
 Fetal Cavum:      Appears normal         Ductal Arch:      Appears normal
 Ventricles:       Appears normal         Diaphragm:        Previously seen
 Choroid Plexus:   Previously seen        Stomach:          Previously Seen
 Cerebellum:       Previously seen        Abdomen:          Previously seen
 Posterior Fossa:  Previously seen        Abdominal Wall:   Previously seen
 Nuchal Fold:      Not applicable (>20    Cord Vessels:     Appears normal (3
                   wks GA)                                  vessel cord)
 Face:             Orbits and profile     Kidneys:          Appear normal
                   previously seen
 Lips:             Previously seen        Bladder:          Appears normal
 Heart:            Appears normal         Spine:            Previously seen
                   (4CH, axis, and
                   situs)
 RVOT:             Appears normal         Lower             Previously seen
                                          Extremities:
 LVOT:             Appears normal         Upper             Previously seen
                                          Extremities:

 Other:  Heels, Nasal bone previously visualized. Female gender. Technically
         difficult due to fetal position.
Cervix Uterus Adnexa

 Cervical Length:    3.8      cm

 Cervix:       Normal appearance by transabdominal scan.
 Uterus:       No abnormality visualized.

 Left Ovary:    No adnexal mass visualized.
 Right Ovary:   No adnexal mass visualized.
 Adnexa:     No abnormality visualized.
Impression

 Single IUP at 33 [DATE] weeks
 Follow up ultrasound due to gestational hypertension
 Interval fetal growth is appropriate (69th %tile)
 Normal interval anatomy
 Normal amniotic fluid volume
Recommendations

 Follow-up ultrasounds as clinically indicated.

## 2016-03-19 DIAGNOSIS — H903 Sensorineural hearing loss, bilateral: Secondary | ICD-10-CM | POA: Insufficient documentation

## 2016-03-19 DIAGNOSIS — H722X2 Other marginal perforations of tympanic membrane, left ear: Secondary | ICD-10-CM | POA: Insufficient documentation

## 2017-05-02 ENCOUNTER — Ambulatory Visit (HOSPITAL_COMMUNITY)
Admission: EM | Admit: 2017-05-02 | Discharge: 2017-05-02 | Disposition: A | Discharge status: Home / Self Care | Payer: Medicaid Other | Attending: Internal Medicine | Admitting: Internal Medicine

## 2017-05-02 ENCOUNTER — Encounter (HOSPITAL_COMMUNITY): Payer: Self-pay | Admitting: *Deleted

## 2017-05-02 ENCOUNTER — Ambulatory Visit (INDEPENDENT_AMBULATORY_CARE_PROVIDER_SITE_OTHER): Payer: Medicaid Other

## 2017-05-02 DIAGNOSIS — M65332 Trigger finger, left middle finger: Secondary | ICD-10-CM

## 2017-05-02 MED ORDER — PREDNISONE 20 MG PO TABS: ORAL_TABLET | ORAL | 0 refills | Status: DC

## 2017-05-02 MED ORDER — NAPROXEN 500 MG PO TABS: 500.0000 mg | ORAL_TABLET | Freq: Two times a day (BID) | ORAL | 0 refills | Status: DC

## 2017-05-02 NOTE — ED Triage Notes (Signed)
C/O left middle finger pain x 3-4 wks ago with finger "getting stuck in position" at times.  Pain extending into hand.  C/O numbness and swelling in finger; cap refill prompt.

## 2017-05-02 NOTE — ED Provider Notes (Signed)
[facility]    CSN: [id number] Arrival date & time: 05/02/17  1326     History   Chief Complaint Chief Complaint  Patient presents with  . Hand Pain    HPI Alicia Ortiz is a 36 y.o. female.   HPI Alicia Ortiz is a 36 y.o. female presenting to UC with c/o 3-4 weeks of gradually worsening Left middle finger pain that keeps "getting stuck" then popping in her hand causing pain at times.  Mild swelling.  No known injury. She is Left hand dominant and does weed whacking with Left hand.  She has numbness at times in the finger. She has been told in the past she had carpal tunnel but has not seen a hand specialist. She does not wear any splints or braces on her hand or wrist. She has not taken any medication for her pain.    Past Medical History:  Diagnosis Date  . Deaf    earing aids, reads lips  . Hypertension   . Pregnancy induced hypertension     Patient Active Problem List   Diagnosis Date Noted  . NSVD (normal spontaneous vaginal delivery) 09/04/2013  . Normal labor 09/03/2013  . Severe Range Gestational hypertension w/o significant proteinuria in 3rd trimester 08/07/2013  . Deaf 03/28/2013  . Supervision of high risk pregnancy in third trimester 03/28/2013    Past Surgical History:  Procedure Laterality Date  . BIOPSY BREAST     right breast    OB History    Gravida Para Term Preterm AB Living   3 2 2  0 1 2   SAB TAB Ectopic Multiple Live Births   1 0 0 0 2       Home Medications    Prior to Admission medications   Medication Sig Start Date End Date Taking? Authorizing Provider  amLODipine (NORVASC) 5 MG tablet Take 1 tablet (5 mg total) by mouth daily. 09/18/13   Constant, Peggy, MD  ferrous sulfate (FERROUSUL) 325 (65 FE) MG tablet Take 1 tablet (325 mg total) by mouth 2 (two) times daily. 09/18/13   Constant, Peggy, MD  hydrochlorothiazide (HYDRODIURIL) 25 MG tablet Take 1 tablet (25 mg total) by mouth daily. 09/14/13   Adam Phenix, MD  ibuprofen (ADVIL,MOTRIN) 600 MG tablet Take 1 tablet (600 mg total) by mouth every 6 (six) hours. 09/18/13   Constant, Peggy, MD  naproxen (NAPROSYN) 500 MG tablet Take 1 tablet (500 mg total) by mouth 2 (two) times daily. 05/02/17   Lurene Shadow, PA-C  predniSONE (DELTASONE) 20 MG tablet 3 tabs po day one, then 2 po daily x 4 days 05/02/17   Lurene Shadow, PA-C  Prenatal Vit-Fe Fumarate-FA (PRENATAL MULTIVITAMIN) TABS tablet Take 1 tablet by mouth daily at 12 noon. 03/28/13   Lesly Dukes, MD    Family History Family History  Problem Relation Age of Onset  . Stroke Mother   . Hypertension Mother   . Hyperlipidemia Mother   . Cancer Daughter        leaukemia    Social History Social History  Substance Use Topics  . Smoking status: Former Smoker    Packs/day: 0.15    Quit date: 08/24/2013  . Smokeless tobacco: Never Used  . Alcohol use No     Allergies   Sulfa antibiotics   Review of Systems Review of Systems  Musculoskeletal: Positive for arthralgias, joint swelling and myalgias.  Skin: Negative for color change and wound.  Neurological: Negative for weakness and numbness.     Physical Exam Triage Vital Signs ED Triage Vitals  Enc Vitals Group     BP 05/02/17 1430 (!) 149/95     Pulse Rate 05/02/17 1430 83     Resp 05/02/17 1430 16     Temp 05/02/17 1430 98.3 F (36.8 C)     Temp Source 05/02/17 1430 Oral     SpO2 05/02/17 1430 100 %     Weight --      Height --      Head Circumference --      Peak Flow --      Pain Score 05/02/17 1432 2     Pain Loc --      Pain Edu? --      Excl. in GC? --    No data found.   Updated Vital Signs BP (!) 149/95   Pulse 83   Temp 98.3 F (36.8 C) (Oral)   Resp 16   LMP 04/13/2017 (Approximate)   SpO2 100%   Breastfeeding? No   Visual Acuity Right Eye Distance:   Left Eye Distance:   Bilateral Distance:    Right Eye Near:   Left Eye Near:    Bilateral Near:     Physical Exam  Constitutional:  She is oriented to person, place, and time. She appears well-developed and well-nourished. No distress.  HENT:  Head: Normocephalic and atraumatic.  Eyes: EOM are normal.  Neck: Normal range of motion.  Cardiovascular: Normal rate.   Pulses:      Radial pulses are 2+ on the left side.  Pulmonary/Chest: Effort normal.  Musculoskeletal: Normal range of motion. She exhibits edema and tenderness.  Left hand: tenderness to volar aspect of 3rd MCP joint and proximal 3rd phalanx.  Full ROM with increased pain on full flexion.   Neurological: She is alert and oriented to person, place, and time.  Skin: Skin is warm and dry. Capillary refill takes less than 2 seconds. She is not diaphoretic.  Left hand: skin in tact. No ecchymosis or erythema.   Psychiatric: She has a normal mood and affect. Her behavior is normal.  Nursing note and vitals reviewed.    UC Treatments / Results  Labs (all labs ordered are listed, but only abnormal results are displayed) Labs Reviewed - No data to display  EKG  EKG Interpretation None       Radiology Dg Hand Complete Left  Result Date: 05/02/2017 CLINICAL DATA:  Left middle finger pain for 3 weeks. History of carpal tunnel. EXAM: LEFT HAND - COMPLETE 3+ VIEW COMPARISON:  None. FINDINGS: Osseous alignment is normal. Bone mineralization is normal. No fracture line or displaced fracture fragment seen. No cortical irregularity or osseous lesion. No degenerative change. Soft tissues about the left hand are unremarkable. IMPRESSION: Negative. Electronically Signed   By: Bary RichardStan  Maynard M.D.   On: 05/02/2017 15:55    Procedures Procedures (including critical care time)  Medications Ordered in UC Medications - No data to display   Initial Impression / Assessment and Plan / UC Course  I have reviewed the triage vital signs and the nursing notes.  Pertinent labs & imaging results that were available during my care of the patient were reviewed by me and  considered in my medical decision making (see chart for details).     Hx and exam c/w trigger finger  Will try finger splint and NSAIDs F/u with PCP, may need f/u with hand surgery if  not improving.   Final Clinical Impressions(s) / UC Diagnoses   Final diagnoses:  Trigger finger, left middle finger    New Prescriptions Discharge Medication List as of 05/02/2017  4:04 PM    START taking these medications   Details  naproxen (NAPROSYN) 500 MG tablet Take 1 tablet (500 mg total) by mouth 2 (two) times daily., Starting Sun 05/02/2017, Normal    predniSONE (DELTASONE) 20 MG tablet 3 tabs po day one, then 2 po daily x 4 days, Normal         Controlled Substance Prescriptions [place] Controlled Substance Registry consulted? Not Applicable   Rolla Plate 05/02/17 2016

## 2018-01-27 ENCOUNTER — Other Ambulatory Visit: Payer: Self-pay | Admitting: Family Medicine

## 2018-01-27 DIAGNOSIS — R110 Nausea: Secondary | ICD-10-CM

## 2018-04-22 ENCOUNTER — Ambulatory Visit
Admission: RE | Admit: 2018-04-22 | Discharge: 2018-04-22 | Disposition: A | Discharge status: Home / Self Care | Payer: Medicaid Other | Source: Ambulatory Visit | Attending: Family Medicine | Admitting: Family Medicine

## 2018-04-22 ENCOUNTER — Other Ambulatory Visit: Payer: Self-pay | Admitting: Family Medicine

## 2018-04-22 DIAGNOSIS — W19XXXA Unspecified fall, initial encounter: Secondary | ICD-10-CM

## 2018-05-02 ENCOUNTER — Other Ambulatory Visit: Payer: Self-pay | Admitting: Family Medicine

## 2018-05-02 ENCOUNTER — Ambulatory Visit
Admission: RE | Admit: 2018-05-02 | Discharge: 2018-05-02 | Disposition: A | Discharge status: Home / Self Care | Payer: Medicaid Other | Source: Ambulatory Visit | Attending: Family Medicine | Admitting: Family Medicine

## 2018-05-02 DIAGNOSIS — S82831A Other fracture of upper and lower end of right fibula, initial encounter for closed fracture: Secondary | ICD-10-CM

## 2019-08-30 ENCOUNTER — Ambulatory Visit
Admission: RE | Admit: 2019-08-30 | Discharge: 2019-08-30 | Disposition: A | Discharge status: Home / Self Care | Payer: Medicaid Other | Source: Ambulatory Visit | Attending: Family Medicine | Admitting: Family Medicine

## 2019-08-30 ENCOUNTER — Other Ambulatory Visit: Payer: Self-pay

## 2019-08-30 ENCOUNTER — Other Ambulatory Visit: Payer: Self-pay | Admitting: Family Medicine

## 2019-08-30 DIAGNOSIS — M25511 Pain in right shoulder: Secondary | ICD-10-CM

## 2019-10-31 ENCOUNTER — Other Ambulatory Visit: Payer: Self-pay | Admitting: Family Medicine

## 2019-10-31 DIAGNOSIS — R748 Abnormal levels of other serum enzymes: Secondary | ICD-10-CM

## 2019-11-06 ENCOUNTER — Other Ambulatory Visit: Payer: Self-pay | Admitting: Family Medicine

## 2019-11-08 ENCOUNTER — Ambulatory Visit
Admission: RE | Admit: 2019-11-08 | Discharge: 2019-11-08 | Disposition: A | Discharge status: Home / Self Care | Payer: Medicaid Other | Source: Ambulatory Visit | Attending: Family Medicine | Admitting: Family Medicine

## 2019-11-08 DIAGNOSIS — R748 Abnormal levels of other serum enzymes: Secondary | ICD-10-CM

## 2021-01-22 ENCOUNTER — Encounter (HOSPITAL_COMMUNITY): Payer: Self-pay | Admitting: Emergency Medicine

## 2021-01-22 ENCOUNTER — Emergency Department (HOSPITAL_COMMUNITY)
Admission: EM | Admit: 2021-01-22 | Discharge: 2021-01-22 | Disposition: A | Discharge status: Home / Self Care | Payer: Medicaid Other | Attending: Emergency Medicine | Admitting: Emergency Medicine

## 2021-01-22 DIAGNOSIS — Z87891 Personal history of nicotine dependence: Secondary | ICD-10-CM | POA: Diagnosis not present

## 2021-01-22 DIAGNOSIS — S0185XA Open bite of other part of head, initial encounter: Secondary | ICD-10-CM

## 2021-01-22 DIAGNOSIS — S01551A Open bite of lip, initial encounter: Secondary | ICD-10-CM | POA: Insufficient documentation

## 2021-01-22 DIAGNOSIS — W540XXA Bitten by dog, initial encounter: Secondary | ICD-10-CM | POA: Insufficient documentation

## 2021-01-22 DIAGNOSIS — I10 Essential (primary) hypertension: Secondary | ICD-10-CM | POA: Insufficient documentation

## 2021-01-22 DIAGNOSIS — Z23 Encounter for immunization: Secondary | ICD-10-CM | POA: Insufficient documentation

## 2021-01-22 DIAGNOSIS — Y9389 Activity, other specified: Secondary | ICD-10-CM | POA: Diagnosis not present

## 2021-01-22 DIAGNOSIS — Z79899 Other long term (current) drug therapy: Secondary | ICD-10-CM | POA: Insufficient documentation

## 2021-01-22 DIAGNOSIS — S0181XA Laceration without foreign body of other part of head, initial encounter: Secondary | ICD-10-CM

## 2021-01-22 MED ORDER — TETANUS-DIPHTH-ACELL PERTUSSIS 5-2.5-18.5 LF-MCG/0.5 IM SUSY
0.5000 mL | PREFILLED_SYRINGE | Freq: Once | INTRAMUSCULAR | Status: AC
Start: 2021-01-22 — End: 2021-01-22
  Administered 2021-01-22: 0.5 mL via INTRAMUSCULAR
  Filled 2021-01-22: qty 0.5

## 2021-01-22 MED ORDER — LIDOCAINE-EPINEPHRINE 2 %-1:200000 IJ SOLN
10.0000 mL | Freq: Once | INTRAMUSCULAR | Status: AC
Start: 2021-01-22 — End: 2021-01-22
  Administered 2021-01-22: 10 mL
  Filled 2021-01-22: qty 20

## 2021-01-22 MED ORDER — AMOXICILLIN-POT CLAVULANATE 875-125 MG PO TABS: 1.0000 | ORAL_TABLET | Freq: Two times a day (BID) | ORAL | 0 refills | Status: AC

## 2021-01-22 NOTE — ED Provider Notes (Signed)
[facility]-EMERGENCY DEPT Provider Note   CSN: [id number] Arrival date & time: 01/22/21  1240     History Chief Complaint  Patient presents with   Animal Bite    Alicia Ortiz is a 40 y.o. female.  HPI Patient is a 40 year old female with a medical history as noted below.  She presents to the emergency department due to a dog bite to the face.  Patient states that her husband's dog was growling at her young daughter so she went to pick up the dog and move it and it bit her along the face just above the left upper lip.  No active bleeding.  Reports moderate pain at the site.  Also reports a small puncture wound to the mucosal surface of the upper lip.  No other complaints.  Unsure of the timing of her last Tdap.    Past Medical History:  Diagnosis Date   Deaf    earing aids, reads lips   Hypertension    Pregnancy induced hypertension     Patient Active Problem List   Diagnosis Date Noted   NSVD (normal spontaneous vaginal delivery) 09/04/2013   Normal labor 09/03/2013   Severe Range Gestational hypertension w/o significant proteinuria in 3rd trimester 08/07/2013   Deaf 03/28/2013   Supervision of high risk pregnancy in third trimester 03/28/2013    Past Surgical History:  Procedure Laterality Date   BIOPSY BREAST     right breast     OB History     Gravida  3   Para  2   Term  2   Preterm  0   AB  1   Living  2      SAB  1   IAB  0   Ectopic  0   Multiple  0   Live Births  2           Family History  Problem Relation Age of Onset   Stroke Mother    Hypertension Mother    Hyperlipidemia Mother    Cancer Daughter        leaukemia    Social History   Tobacco Use   Smoking status: Former    Packs/day: 0.15    Types: Cigarettes    Quit date: 08/24/2013    Years since quitting: 7.4   Smokeless tobacco: Never  Substance Use Topics   Alcohol use: No   Drug use: No    Home Medications Prior to Admission  medications   Medication Sig Start Date End Date Taking? Authorizing Provider  amoxicillin-clavulanate (AUGMENTIN) 875-125 MG tablet Take 1 tablet by mouth every 12 (twelve) hours for 7 days. 01/22/21 01/29/21 Yes Placido Sou, PA-C  amLODipine (NORVASC) 5 MG tablet Take 1 tablet (5 mg total) by mouth daily. 09/18/13   Constant, Peggy, MD  ferrous sulfate (FERROUSUL) 325 (65 FE) MG tablet Take 1 tablet (325 mg total) by mouth 2 (two) times daily. 09/18/13   Constant, Peggy, MD  hydrochlorothiazide (HYDRODIURIL) 25 MG tablet Take 1 tablet (25 mg total) by mouth daily. 09/14/13   Adam Phenix, MD  ibuprofen (ADVIL,MOTRIN) 600 MG tablet Take 1 tablet (600 mg total) by mouth every 6 (six) hours. 09/18/13   Constant, Peggy, MD  naproxen (NAPROSYN) 500 MG tablet Take 1 tablet (500 mg total) by mouth 2 (two) times daily. 05/02/17   Lurene Shadow, PA-C  predniSONE (DELTASONE) 20 MG tablet 3 tabs po day one, then 2 po daily x 4  days 05/02/17   Lurene Shadow, PA-C  Prenatal Vit-Fe Fumarate-FA (PRENATAL MULTIVITAMIN) TABS tablet Take 1 tablet by mouth daily at 12 noon. 03/28/13   Lesly Dukes, MD    Allergies    Sulfa antibiotics  Review of Systems   Review of Systems  Constitutional:  Negative for fever.  Gastrointestinal:  Negative for vomiting.  Skin:  Positive for wound. Negative for color change.   Physical Exam Updated Vital Signs BP (!) 155/93   Pulse (!) 103   Temp 98.5 F (36.9 C)   Resp 18   SpO2 99%   Physical Exam Vitals and nursing note reviewed.  Constitutional:      General: She is not in acute distress.    Appearance: Normal appearance. She is not ill-appearing, toxic-appearing or diaphoretic.  HENT:     Head: Normocephalic.     Comments: 1 inch laceration noted above the left upper lip.  Abuts the vermilion border.  No active bleeding.  Mild tenderness at the site.  Additional small puncture wound noted to the mucosal surface of the upper lip.  No active bleeding.     Right Ear: External ear normal.     Left Ear: External ear normal.     Nose: Nose normal.     Mouth/Throat:     Mouth: Mucous membranes are moist.     Pharynx: Oropharynx is clear. No oropharyngeal exudate or posterior oropharyngeal erythema.  Eyes:     Extraocular Movements: Extraocular movements intact.  Cardiovascular:     Rate and Rhythm: Normal rate and regular rhythm.     Pulses: Normal pulses.  Pulmonary:     Effort: Pulmonary effort is normal.  Abdominal:     General: Abdomen is flat.     Tenderness: There is no abdominal tenderness.  Musculoskeletal:        General: Normal range of motion.     Cervical back: Normal range of motion and neck supple. No tenderness.  Skin:    General: Skin is warm and dry.  Neurological:     General: No focal deficit present.     Mental Status: She is alert and oriented to person, place, and time.  Psychiatric:        Mood and Affect: Mood normal.        Behavior: Behavior normal.    ED Results / Procedures / Treatments   Labs (all labs ordered are listed, but only abnormal results are displayed) Labs Reviewed - No data to display  EKG None  Radiology No results found.  Procedures .Marland KitchenLaceration Repair  Date/Time: 01/22/2021 2:43 PM Performed by: Placido Sou, PA-C Authorized by: Placido Sou, PA-C   Consent:    Consent obtained:  Verbal   Consent given by:  Patient   Risks discussed:  Infection, need for additional repair, pain, poor cosmetic result and poor wound healing   Alternatives discussed:  No treatment and delayed treatment Universal protocol:    Procedure explained and questions answered to patient or proxy's satisfaction: yes     Relevant documents present and verified: yes     Test results available: yes     Imaging studies available: yes     Required blood products, implants, devices, and special equipment available: yes     Site/side marked: yes     Immediately prior to procedure, a time out was  called: yes     Patient identity confirmed:  Verbally with patient Anesthesia:    Anesthesia method:  Local  infiltration Laceration details:    Location:  Face   Facial location: Above the upper lip.   Length (cm):  3 Pre-procedure details:    Preparation:  Patient was prepped and draped in usual sterile fashion Exploration:    Wound exploration: wound explored through full range of motion   Treatment:    Amount of cleaning:  Extensive   Irrigation solution:  Sterile saline   Irrigation method:  Pressure wash Skin repair:    Repair method:  Sutures   Suture size:  6-0   Suture material:  Prolene   Suture technique:  Simple interrupted   Number of sutures:  4 Approximation:    Approximation:  Close Repair type:    Repair type:  Intermediate Post-procedure details:    Dressing:  Open (no dressing)   Procedure completion:  Tolerated well, no immediate complications   Medications Ordered in ED Medications  lidocaine-EPINEPHrine (XYLOCAINE W/EPI) 2 %-1:200000 (PF) injection 10 mL (10 mLs Infiltration Given by Other 01/22/21 1431)  Tdap (BOOSTRIX) injection 0.5 mL (0.5 mLs Intramuscular Given 01/22/21 1405)    ED Course  I have reviewed the triage vital signs and the nursing notes.  Pertinent labs & imaging results that were available during my care of the patient were reviewed by me and considered in my medical decision making (see chart for details).    MDM Rules/Calculators/A&P                          Patient is a 40 year old female who presents to the emergency department due to a dog bite to the face that occurred just prior to arrival.  Patient has a 1 inch laceration just above the left upper lip.  It abuts the vermilion border but does not appear to cross it.  I discussed the increased risk of infection due to the dog bite hand the increased risk if I repaired her laceration.  She verbalized understanding of this and would like to move forward with laceration repair.   Please see the procedure note above.  She had an additional puncture wound to the mucosal surface of the upper lip but this was not amenable to sutures.  Patient states this is her husband's dog.  She is unsure if it is up-to-date on its vaccinations.  We discussed the risk of rabies.  She is going to discuss further with her husband this evening and states she is also going to call animal control.  She understands to return to the emergency department if she cannot confirm the dog's immunization status so that she can receive the rabies vaccination.  Tdap updated in the emergency department.  We will discharge patient on a 1 week course of Augmentin.  Suture removal in 7 days.  We discussed signs and symptoms of infection and patient knows to return to the emergency department if her symptoms worsen.  Also discussed wound care in length.  Feel the patient is stable for discharge at this time and she is agreeable.  Her questions were answered and she was amicable at the time of discharge.  Final Clinical Impression(s) / ED Diagnoses Final diagnoses:  Dog bite of face, initial encounter   Rx / DC Orders ED Discharge Orders          Ordered    amoxicillin-clavulanate (AUGMENTIN) 875-125 MG tablet  Every 12 hours        01/22/21 1354  Placido SouJoldersma, Eldin Bonsell, PA-C 01/22/21 1447    Little, Ambrose Finlandachel Morgan, MD 01/22/21 1620

## 2021-01-22 NOTE — Discharge Instructions (Addendum)
I am prescribing you an antibiotic called Augmentin.  Please take this twice a day for the next 7 days.  Do not stop taking this antibiotic early.  Please apply triple antibiotic to the wound 1-2 times per day.  Please discuss with your husband further regarding your dogs immunization status.  If you cannot determine if your dog's been vaccinated you will need to come back to the emergency department so that you can receive the rabies vaccine.  I would also discuss with animal control.  Please have your stitches removed in 1 week.  If you develop any fevers, redness, swelling, discharge from the site, please come back to the emergency department for reevaluation.  It was a pleasure to meet you.

## 2021-01-22 NOTE — ED Triage Notes (Signed)
Patient c/o dog bite to L upper lip. Approximately one inch laceration. Bleeding controlled. States it was husband's dog. Unsure of immunization status. Unknown last tetanus.

## 2021-01-30 ENCOUNTER — Ambulatory Visit
Admission: EM | Admit: 2021-01-30 | Discharge: 2021-01-30 | Disposition: A | Discharge status: Home / Self Care | Payer: Medicaid Other | Attending: Urgent Care | Admitting: Urgent Care

## 2021-01-30 ENCOUNTER — Other Ambulatory Visit: Payer: Self-pay

## 2021-01-30 DIAGNOSIS — Z4802 Encounter for removal of sutures: Secondary | ICD-10-CM

## 2021-01-30 NOTE — ED Triage Notes (Signed)
Removed 4 sutures from lt upper lip area. No redness or drainage noted.

## 2021-06-04 ENCOUNTER — Ambulatory Visit
Admission: EM | Admit: 2021-06-04 | Discharge: 2021-06-04 | Disposition: A | Discharge status: Home / Self Care | Payer: Medicaid Other | Attending: Physician Assistant | Admitting: Physician Assistant

## 2021-06-04 ENCOUNTER — Encounter: Payer: Self-pay | Admitting: Emergency Medicine

## 2021-06-04 ENCOUNTER — Other Ambulatory Visit: Payer: Self-pay

## 2021-06-04 DIAGNOSIS — I10 Essential (primary) hypertension: Secondary | ICD-10-CM | POA: Diagnosis not present

## 2021-06-04 DIAGNOSIS — J209 Acute bronchitis, unspecified: Secondary | ICD-10-CM | POA: Diagnosis not present

## 2021-06-04 MED ORDER — LISINOPRIL 10 MG PO TABS: 10.0000 mg | ORAL_TABLET | Freq: Every day | ORAL | 0 refills | Status: AC

## 2021-06-04 MED ORDER — PREDNISONE 20 MG PO TABS: 40.0000 mg | ORAL_TABLET | Freq: Every day | ORAL | 0 refills | Status: AC

## 2021-06-04 NOTE — ED Triage Notes (Signed)
Patient c/o non-productive cough, fatigue, believes she had the flu 2 weeks ago.  Unable to rest due to cough.  No nausea, some headaches.  Patient is not vaccinated for COVID.

## 2021-06-04 NOTE — ED Provider Notes (Signed)
[facility]    CSN: [id number] Arrival date & time: 06/04/21  8299      History   Chief Complaint Chief Complaint  Patient presents with   Cough    HPI Alicia Ortiz is a 40 y.o. female.   Patient here today for evaluation of cough that she has had for the last 2 weeks.  She states that cough is nonproductive.  She has had some low-grade fever at times but thinks she had the flu 2 weeks ago.  She has not had any nausea but has had some headaches.  She has tried over-the-counter medication without significant relief.  Patient also requests lisinopril for her HTN. She states she has taken this in the past. She has had difficulty getting in to see her PCP.   The history is provided by the patient.  Cough Associated symptoms: no chills, no ear pain, no eye discharge, no fever, no shortness of breath, no sore throat and no wheezing    Past Medical History:  Diagnosis Date   Deaf    earing aids, reads lips   Hypertension    Pregnancy induced hypertension     Patient Active Problem List   Diagnosis Date Noted   NSVD (normal spontaneous vaginal delivery) 09/04/2013   Normal labor 09/03/2013   Severe Range Gestational hypertension w/o significant proteinuria in 3rd trimester 08/07/2013   Deaf 03/28/2013   Supervision of high risk pregnancy in third trimester 03/28/2013    Past Surgical History:  Procedure Laterality Date   BIOPSY BREAST     right breast    OB History     Gravida  3   Para  2   Term  2   Preterm  0   AB  1   Living  2      SAB  1   IAB  0   Ectopic  0   Multiple  0   Live Births  2            Home Medications    Prior to Admission medications   Medication Sig Start Date End Date Taking? Authorizing Provider  lisinopril (ZESTRIL) 10 MG tablet Take 1 tablet (10 mg total) by mouth daily. 06/04/21 07/04/21 Yes Tomi Bamberger, PA-C  predniSONE (DELTASONE) 20 MG tablet Take 2 tablets (40 mg total) by mouth  daily with breakfast for 5 days. 06/04/21 06/09/21 Yes Tomi Bamberger, PA-C  amLODipine (NORVASC) 5 MG tablet Take 1 tablet (5 mg total) by mouth daily. 09/18/13   Constant, Peggy, MD  ferrous sulfate (FERROUSUL) 325 (65 FE) MG tablet Take 1 tablet (325 mg total) by mouth 2 (two) times daily. 09/18/13   Constant, Peggy, MD  hydrochlorothiazide (HYDRODIURIL) 25 MG tablet Take 1 tablet (25 mg total) by mouth daily. 09/14/13   Adam Phenix, MD  ibuprofen (ADVIL,MOTRIN) 600 MG tablet Take 1 tablet (600 mg total) by mouth every 6 (six) hours. 09/18/13   Constant, Peggy, MD  naproxen (NAPROSYN) 500 MG tablet Take 1 tablet (500 mg total) by mouth 2 (two) times daily. 05/02/17   Lurene Shadow, PA-C  Prenatal Vit-Fe Fumarate-FA (PRENATAL MULTIVITAMIN) TABS tablet Take 1 tablet by mouth daily at 12 noon. 03/28/13   Lesly Dukes, MD    Family History Family History  Problem Relation Age of Onset   Stroke Mother    Hypertension Mother    Hyperlipidemia Mother    Cancer Daughter  leaukemia    Social History Social History   Tobacco Use   Smoking status: Former    Packs/day: 0.15    Types: Cigarettes    Quit date: 08/24/2013    Years since quitting: 7.7   Smokeless tobacco: Never  Substance Use Topics   Alcohol use: No   Drug use: No     Allergies   Sulfa antibiotics   Review of Systems Review of Systems  Constitutional:  Negative for chills and fever.  HENT:  Positive for congestion. Negative for ear pain and sore throat.   Eyes:  Negative for discharge and redness.  Respiratory:  Positive for cough. Negative for shortness of breath and wheezing.   Gastrointestinal:  Negative for abdominal pain, diarrhea, nausea and vomiting.    Physical Exam Triage Vital Signs ED Triage Vitals  Enc Vitals Group     BP 06/04/21 1035 (!) 163/96     Pulse Rate 06/04/21 1035 89     Resp --      Temp 06/04/21 1035 98.5 F (36.9 C)     Temp Source 06/04/21 1035 Oral     SpO2 06/04/21  1035 98 %     Weight 06/04/21 1037 146 lb (66.2 kg)     Height --      Head Circumference --      Peak Flow --      Pain Score 06/04/21 1037 0     Pain Loc --      Pain Edu? --      Excl. in [place]? --    No data found.  Updated Vital Signs BP (!) 163/96 (BP Location: Right Arm)   Pulse 89   Temp 98.5 F (36.9 C) (Oral)   Wt 146 lb (66.2 kg)   LMP 05/06/2021   SpO2 98%   BMI 26.70 kg/m      Physical Exam Vitals and nursing note reviewed.  Constitutional:      General: She is not in acute distress.    Appearance: Normal appearance. She is not ill-appearing.  HENT:     Head: Normocephalic and atraumatic.     Nose: Congestion present.     Mouth/Throat:     Mouth: Mucous membranes are moist.     Pharynx: No oropharyngeal exudate or posterior oropharyngeal erythema.  Eyes:     Conjunctiva/sclera: Conjunctivae normal.  Cardiovascular:     Rate and Rhythm: Normal rate and regular rhythm.     Heart sounds: Normal heart sounds. No murmur heard. Pulmonary:     Effort: Pulmonary effort is normal. No respiratory distress.     Breath sounds: Normal breath sounds. No wheezing, rhonchi or rales.  Skin:    General: Skin is warm and dry.  Neurological:     Mental Status: She is alert.  Psychiatric:        Mood and Affect: Mood normal.        Thought Content: Thought content normal.     UC Treatments / Results  Labs (all labs ordered are listed, but only abnormal results are displayed) Labs Reviewed - No data to display  EKG   Radiology No results found.  Procedures Procedures (including critical care time)  Medications Ordered in UC Medications - No data to display  Initial Impression / Assessment and Plan / UC Course  I have reviewed the triage vital signs and the nursing notes.  Pertinent labs & imaging results that were available during my care of the patient were reviewed by  me and considered in my medical decision making (see chart for details).   Steroid  prescribed for suspected bronchitis. Recommend follow up if no improvement or symptoms worsen.   Will send in lisinopril for hypertension as requested. Recommended follow up with PCP for further refills.   Final Clinical Impressions(s) / UC Diagnoses   Final diagnoses:  Acute bronchitis, unspecified organism  Essential hypertension     Discharge Instructions      Please make follow up with your PCP within the next 4 weeks.      ED Prescriptions     Medication Sig Dispense Auth. Provider   predniSONE (DELTASONE) 20 MG tablet Take 2 tablets (40 mg total) by mouth daily with breakfast for 5 days. 10 tablet Ewell Poe F, PA-C   lisinopril (ZESTRIL) 10 MG tablet Take 1 tablet (10 mg total) by mouth daily. 30 tablet Francene Finders, PA-C      PDMP not reviewed this encounter.   Francene Finders, PA-C 06/04/21 1217

## 2021-06-04 NOTE — Discharge Instructions (Signed)
Please make follow up with your PCP within the next 4 weeks.

## 2021-09-12 ENCOUNTER — Ambulatory Visit
Admission: EM | Admit: 2021-09-12 | Discharge: 2021-09-12 | Disposition: A | Discharge status: Home / Self Care | Payer: Medicaid Other | Attending: Internal Medicine | Admitting: Internal Medicine

## 2021-09-12 ENCOUNTER — Other Ambulatory Visit: Payer: Self-pay

## 2021-09-12 DIAGNOSIS — M79601 Pain in right arm: Secondary | ICD-10-CM

## 2021-09-12 MED ORDER — PREDNISONE 20 MG PO TABS: 40.0000 mg | ORAL_TABLET | Freq: Every day | ORAL | 0 refills | Status: AC

## 2021-09-12 NOTE — ED Triage Notes (Signed)
Right shoulder, right arm pain "feels like I hit my funny bone" 3 days ago but then states "I had this most of my life b/c my momma broke my collor bone when I was a kid."  ?

## 2021-09-12 NOTE — Discharge Instructions (Signed)
You have been prescribed prednisone steroid to decrease inflammation associated with your arm pain.  Please follow-up with orthopedist for further evaluation and management.  Also use ice application to affected area. ?
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
## 2021-09-15 ENCOUNTER — Ambulatory Visit: Payer: Self-pay

## 2021-09-15 ENCOUNTER — Other Ambulatory Visit: Payer: Self-pay | Admitting: Physician Assistant

## 2021-09-15 ENCOUNTER — Other Ambulatory Visit: Payer: Self-pay

## 2021-09-15 ENCOUNTER — Encounter: Payer: Self-pay | Admitting: Physician Assistant

## 2021-09-15 ENCOUNTER — Ambulatory Visit (INDEPENDENT_AMBULATORY_CARE_PROVIDER_SITE_OTHER): Payer: Medicaid Other | Admitting: Physician Assistant

## 2021-09-15 DIAGNOSIS — M25511 Pain in right shoulder: Secondary | ICD-10-CM

## 2021-09-15 DIAGNOSIS — G8929 Other chronic pain: Secondary | ICD-10-CM | POA: Diagnosis not present

## 2021-09-15 MED ORDER — MELOXICAM 15 MG PO TABS: 15.0000 mg | ORAL_TABLET | Freq: Every day | ORAL | 1 refills | Status: AC
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
## 2021-09-23 ENCOUNTER — Other Ambulatory Visit: Payer: Self-pay

## 2021-09-23 ENCOUNTER — Ambulatory Visit: Payer: Medicaid Other | Attending: Physician Assistant | Admitting: Occupational Therapy

## 2021-09-23 DIAGNOSIS — R202 Paresthesia of skin: Secondary | ICD-10-CM | POA: Diagnosis present

## 2021-09-23 DIAGNOSIS — M25611 Stiffness of right shoulder, not elsewhere classified: Secondary | ICD-10-CM | POA: Diagnosis present

## 2021-09-23 DIAGNOSIS — R29898 Other symptoms and signs involving the musculoskeletal system: Secondary | ICD-10-CM | POA: Insufficient documentation

## 2021-09-23 DIAGNOSIS — M25511 Pain in right shoulder: Secondary | ICD-10-CM | POA: Insufficient documentation

## 2021-09-23 DIAGNOSIS — G8929 Other chronic pain: Secondary | ICD-10-CM | POA: Insufficient documentation

## 2021-09-23 NOTE — Therapy (Signed)
[facility] ?[address] ?[place], [place], [address] ?Phone: [phone]   Fax:  [phone] ? ?Occupational Therapy Evaluation ? ?Patient Details  ?Name: Alicia Ortiz ?MRN: [id number] ?Date of Birth: [birth year] ?Referring Provider (OT): West Bali Persons, PA (ortho) ? ? ?Encounter Date: 09/23/2021 ? ? OT End of Session - 09/23/21 0859   ? ? Visit Number 1   ? Number of Visits 17   ? Date for OT Re-Evaluation 12/22/21   ? Authorization Type [place] [employer]   ? Authorization Time Period VL: 27   ? OT Start Time 0848   ? OT Stop Time 0935   ? OT Time Calculation (min) 47 min   ? Activity Tolerance Patient limited by pain   ? Behavior During Therapy [facility] for tasks assessed/performed   tangential  ? ?  ?  ? ?  ? ?Past Medical History:  ?Diagnosis Date  ? 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 Hypertension   ? Pregnancy induced hypertension   ? ? ?Past Surgical History:  ?Procedure Laterality Date  ? BIOPSY BREAST    ? right breast  ? ? ?There were no vitals filed for this visit. ? ? Subjective Assessment - 09/23/21 0851   ? ? Subjective  Pt arrives to session today w/ primary concern related to chronic and significant R shoulder pain. Reports "lots of overuse" due to prior caretaking responsibilities and current work-related requirements. Pt also reports she recently lost her mother and is deaf, becoming tearful when describing both and their current impact on her. Pt states the pain in her shoulder is mostly limiting at work and w/ tasks requiring overhead movement (e.g., retrieving objects from cabinets, hair care).   ? Pertinent History Chronic R shoulder pain; hx of hearing impairment and HTN   ? Limitations Deaf (reads lips)   Reports she used to have hearing aids, but is not wearing them currently due to improper fit  ? Currently in Pain? Yes   ? Pain Score 5    ? Pain Location Shoulder   ? Pain Orientation Right   ? Pain Type Chronic pain   ? Pain Onset  More than a month ago   ? Pain Frequency Constant   ? Aggravating Factors  Movement   ? Effect of Pain on Daily Activities Very limiting on ADLs   ? ?  ?  ? ?  ? ? [facility] OT Assessment - 09/23/21 0902   ? ?  ? Assessment  ? Medical Diagnosis R shoulder pain   ? Referring Provider (OT) West Bali Persons, PA   ortho  ? Hand Dominance Left   Does use RUE for some GM tasks (laundry, lifting, etc)  ? Prior Therapy Yes   Prior OT/PT for R knee, bilateral hands  ?  ? Precautions  ? Precautions None   ?  ? Prior Function  ? Level of Independence Independent   ? Vocation Full time employment   ? 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  ? ADL  ? ADL comments Mod I w/ most BADLs, requiring compensation/modification; reports pain and decreased ROM is primary limiting factor   ?  ? Observation/Other Assessments  ? Focus on Therapeutic Outcomes (FOTO)  32   ?  ? Sensation  ? Additional Comments Reports occasional tingling and numbness from shoulder to elbow   ?  ? Coordination  ?  Gross Motor Movements are Fluid and Coordinated No   ? Fine Motor Movements are Fluid and Coordinated Yes   ?  ? ROM / Strength  ? AROM / PROM / Strength AROM;Strength;PROM   ?  ? AROM  ? Overall AROM  Deficits;Due to pain   ? Overall AROM Comments Most limitations and pain w/ R shoulder flexion/scaption, external rotation, and abduction; elbow, wrist, and hand WFL   ? AROM Assessment Site Shoulder   ? Right/Left Shoulder Right   ?  ? PROM  ? Overall PROM Comments To be assessed   ?  ? Strength  ? Overall Strength Deficits;Due to pain   ? Overall Strength Comments LUE WFL; grossly 5/5   ? Strength Assessment Site Shoulder;Elbow   ? Right/Left Shoulder Right   ? Right Shoulder Flexion 3-/5   ? Right Shoulder Extension 5/5   ? Right Shoulder ABduction 2+/5   ? Right Shoulder Internal Rotation 5/5   ? Right Shoulder External Rotation 3+/5   ? Right/Left Elbow Right   ? Right Elbow  Flexion 5/5   ? Right Elbow Extension 3/5   ? ?  ?  ? ?  ? ? OT Short Term Goals - 09/23/21 1206   ? ?  ? OT SHORT TERM GOAL #1  ? Title Pt will verbalize understanding of joint protection and pain management strategies   ? Baseline Decreased knowledge of joint protection   ? Time 4   ? Period Weeks   ? Status New   ? Target Date 10/24/21   ?  ? OT SHORT TERM GOAL #2  ? Title Pt will demonstrate understanding of initial HEP designed for gentle ROM   ? Baseline No HEP at this time   ? Time 4   ? Period Weeks   ? Status New   ? Target Date 10/24/21   ?  ? OT SHORT TERM GOAL #3  ? Title Pt will be able to move through at least 50% of R shoulder AROM w/ pain less than 5/10   ? Baseline Significant pain w/ greater than 25% of AROM   ? Time 4   ? Period Weeks   ? Status New   ? Target Date 10/24/21   ? ?  ?  ? ?  ? ? OT Long Term Goals - 09/23/21 1209   ? ?  ? OT LONG TERM GOAL #1  ? Title Pt will improve FOTO score to at least 49 by d/c   ? Baseline Intake: 32   ? Time 8   ? Period Weeks   ? Status New   ? Target Date 11/21/21   ?  ? OT LONG TERM GOAL #2  ? Title Pt will be able to retrieve a medium-weight object (<10 lb) from overhead height within tolerable level of discomfort using RUE and/or BUEs as appropriate   ? Baseline Significant pain w/ functional overhead reach   ? Time 8   ? Period Weeks   ? Status New   ? Target Date 11/21/21   ?  ? OT LONG TERM GOAL #3  ? Title Pt will report pain less than 4/10 in at least 3 sessions prior to d/c   ? Baseline 6/10   ? Time 8   ? Period Weeks   ? Status New   ? Target Date 11/21/21   ?  ? OT LONG TERM GOAL #4  ? Title Pt to improve R shoulder flexion/scaption strength  to at least 3+/5 (complete ROM against gravity/slight resistance)   ? Baseline 3-/5   ? Time 8   ? Period Weeks   ? Status New   ? Target Date 11/21/21   ? ?  ?  ? ?  ? ? Plan - 09/23/21 1153   ? ? Clinical Impression Statement Pt is a 41 y/o female who presents to OP OT due to chronic R shoulder pain. PMH  includes hearing impairment and HTN. Pt currently lives with her family and works full time w/ her husband completing a variety of tasks that typically involve heavy lifting and labor, which are aggravating to her shoulder. OT did initiate condition-specific education this session, discussing suspected rotator cuff tendinitis and answering pt's questions as able. Pt will benefit from skilled occupational therapy services to address pain management, as well as ROM, altered sensation, joint protection, introduction of compensatory strategies/AE prn, and implementation of an HEP to improve functional use of RUE and decrease impact of pain on daily tasks.   ? OT Occupational Profile and History Problem Focused Assessment - Including review of records relating to presenting problem   ? Occupational performance deficits (Please refer to evaluation for details): ADL's;IADL's;Rest and Sleep;Work   ? Body Structure / Function / Physical Skills Decreased knowledge of use of DME;Pain;Body mechanics;UE functional use;IADL;ROM;Sensation;Decreased knowledge of precautions;ADL   ? Rehab Potential Good   ? Clinical Decision Making Several treatment options, min-mod task modification necessary   ? Comorbidities Affecting Occupational Performance: Presence of comorbidities impacting occupational performance   ? Modification or Assistance to Complete Evaluation  Min-Moderate modification of tasks or assist with assess necessary to complete eval   ? OT Frequency 2x / week   Will likely decrease frequency to 1x/week depending on progress after initial visits  ? OT Duration 8 weeks   ? OT Treatment/Interventions Self-care/ADL training;Moist Heat;DME and/or AE instruction;Compression bandaging;Therapeutic activities;Ultrasound;Therapeutic exercise;Passive range of motion;Iontophoresis;Cryotherapy;Electrical Stimulation;Manual Therapy;Patient/family education   ? Consulted and Agree with Plan of Care Patient   ? ?  ?  ? ?  ? ?Patient will  benefit from skilled therapeutic intervention in order to improve the following deficits and impairments:   ?Body Structure / Function / Physical Skills: Decreased knowledge of use of DME, Pain, Body mechanics,

## 2021-09-25 ENCOUNTER — Ambulatory Visit: Payer: Medicaid Other | Admitting: Occupational Therapy

## 2021-09-30 ENCOUNTER — Encounter: Payer: Self-pay | Admitting: Occupational Therapy

## 2021-09-30 ENCOUNTER — Ambulatory Visit: Payer: Medicaid Other | Admitting: Occupational Therapy

## 2021-09-30 ENCOUNTER — Other Ambulatory Visit: Payer: Self-pay

## 2021-09-30 DIAGNOSIS — M25511 Pain in right shoulder: Secondary | ICD-10-CM | POA: Diagnosis not present

## 2021-09-30 DIAGNOSIS — R29898 Other symptoms and signs involving the musculoskeletal system: Secondary | ICD-10-CM

## 2021-09-30 DIAGNOSIS — R202 Paresthesia of skin: Secondary | ICD-10-CM

## 2021-09-30 DIAGNOSIS — G8929 Other chronic pain: Secondary | ICD-10-CM

## 2021-09-30 DIAGNOSIS — M25611 Stiffness of right shoulder, not elsewhere classified: Secondary | ICD-10-CM

## 2021-09-30 NOTE — Therapy (Signed)
?OUTPATIENT OCCUPATIONAL THERAPY TREATMENT NOTE ? ? ?Patient Name: Alicia Ortiz ?MRN: [id number] ?DOB:1981/06/25, 41 y.o., female ?Today's Date: 09/30/2021 ? ?PCP: [name], MD ?REFERRING PROVIDER: West Bali Persons, PA (ortho) ? ? OT End of Session - 09/30/21 0845   ? ? Visit Number 2   ? Number of Visits 9   ? Date for OT Re-Evaluation 12/22/21   ? Authorization Type [place] [employer]   ? Authorization Time Period VL: 27   ? Authorization - Visit Number 1   ? Authorization - Number of Visits 8   ? OT Start Time 0840   ? OT Stop Time 0925   ? OT Time Calculation (min) 45 min   ? Activity Tolerance Patient limited by pain   ? Behavior During Therapy [facility] for tasks assessed/performed   tangential  ? ?  ?  ? ?  ? ?Past Medical History:  ?Diagnosis Date  ? 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 Hypertension   ? Pregnancy induced hypertension   ? ?Past Surgical History:  ?Procedure Laterality Date  ? BIOPSY BREAST    ? right breast  ? ?Patient Active Problem List  ? Diagnosis Date Noted  ? Pain in right shoulder 09/15/2021  ? NSVD (normal spontaneous vaginal delivery) 09/04/2013  ? Normal labor 09/03/2013  ? Severe Range Gestational hypertension w/o significant proteinuria in 3rd trimester 08/07/2013  ? Deaf 03/28/2013  ? Supervision of high risk pregnancy in third trimester 03/28/2013  ? ? ?ONSET DATE: Chronic ? ?REFERRING DIAG: R shoulder pain ? ?THERAPY DIAG:  ?Chronic right shoulder pain ? ?Stiffness of right shoulder, not elsewhere classified ? ?Paresthesia of skin ? ?Other symptoms and signs involving the musculoskeletal system ? ? ?PERTINENT HISTORY: Chronic R shoulder pain; hx of hearing impairment and HTN ? ?PRECAUTIONS: None ? ?SUBJECTIVE: Pt reports she has tried using her RUE less, per OT recommendations, but some movements are "hard to avoid" ? ?PAIN:  ?Are you having pain? Yes: NPRS scale: 4/10 ?Pain location: R shoulder; pain from proximal shoulder (near collarbone) down to front of UE ?Pain  description: achy ?Aggravating factors: Movement, lifting overhead ?Relieving factors: Rest and avoiding movement ? ?OBJECTIVE:  ? ?TODAY'S TREATMENT:  ?Pendulum exercise completed x5 slowly each direction w/ tabletop support. OT provided demonstration, tactile/verbal cues for positioning and to lead RUE motion w/ body vs activation of shoulder ?Pec stretch in supine w/ towel roll positioned lengthwise along spine; pt able to hold position w/ decreasing pain for 4 min. OT provided relevant education regarding benefit of exercise and alignment and positioning for carryover to home ?PROM of R shoulder ext/int rotation and flex/extension; pt able to move through full extension and int rotation w/out pain, but 50% of ext rotation and flex. OT provided max cues to relax and not activate muscles during passive movement ?Cold pack applied to R anterior and posterior shoulder for 5 min w/out adverse reaction; during modality, OT provided relevant education on pain management strategies ? ? ?PATIENT EDUCATION: ?Education details: Education provided in initial HEP designed for pain-free pendulum exercises and gentle stretching; also discussed POC and pain management strategies (avoiding sleeping on R side, compressive movements to shoulder, overhead reach; cold modalities) ?Person educated: Patient ?Education method: Explanation, Demonstration, Tactile cues, and Verbal cues ?Education comprehension: verbalized understanding and returned demonstration ? ? ?HOME EXERCISE PROGRAM ?MedBridge Code: [id number] ?Pendulum circles; pec stretch in supine w/ towel roll ? ? OT Short Term Goals  ? ?  ?  OT SHORT TERM GOAL #1  ? Title Pt will verbalize understanding of joint protection and pain management strategies   ? Baseline Decreased knowledge of joint protection   ? Time 4   ? Period Weeks   ? Status On-going   ? Target Date 10/24/21   ?  ? OT SHORT TERM GOAL #2  ? Title Pt will demonstrate understanding of initial HEP designed for  gentle ROM   ? Baseline No HEP at this time   ? Time 4   ? Period Weeks   ? Status On-going   ? Target Date 10/24/21   ?  ? OT SHORT TERM GOAL #3  ? Title Pt will be able to move through at least 50% of R shoulder AROM w/ pain less than 5/10   ? Baseline Significant pain w/ greater than 25% of AROM   ? Time 4   ? Period Weeks   ? Status On-going   ? Target Date 10/24/21   ? ?  ?  ? ? OT Long Term Goals  ? ?  ? OT LONG TERM GOAL #1  ? Title Pt will improve FOTO score to at least 49 by d/c   ? Baseline Intake: 32   ? Time 8   ? Period Weeks   ? Status On-going   ? Target Date 11/21/21   ?  ? OT LONG TERM GOAL #2  ? Title Pt will be able to retrieve a medium-weight object (<10 lb) from overhead height within tolerable level of discomfort using RUE and/or BUEs as appropriate   ? Baseline Significant pain w/ functional overhead reach   ? Time 8   ? Period Weeks   ? Status On-going   ? Target Date 11/21/21   ?  ? OT LONG TERM GOAL #3  ? Title Pt will report pain less than 4/10 in at least 3 sessions prior to d/c   ? Baseline 6/10   ? Time 8   ? Period Weeks   ? Status On-going   ? Target Date 11/21/21   ?  ? OT LONG TERM GOAL #4  ? Title Pt to improve R shoulder flexion/scaption strength to at least 3+/5 (complete ROM against gravity/slight resistance)   ? Baseline 3-/5   ? Time 8   ? Period Weeks   ? Status On-going   ? Target Date 11/21/21   ? ?  ?  ? ?  ? ? Plan  ? ? Clinical Impression Statement Pt returns to OP OT after initial evaluation for R shoulder pain last week. OT initiated HEP this session and discussed POC involving first reducing pain, maintaining/increasing ROM, and then strengthening; pt verbalized understanding of POC at this time. OT also incorporated cold pack this session for pain management and educated pt on benefit of pain management strategies w/ initial HEP for ultimately improving functional use of RUE. Pt is most limited w/ shoulder flexion and external rotation, but had demonstrated increased  range w/ passive movement vs. active movement.   ? OT Occupational Profile and History Problem Focused Assessment - Including review of records relating to presenting problem   ? Occupational performance deficits (Please refer to evaluation for details): ADL's;IADL's;Rest and Sleep;Work   ? Body Structure / Function / Physical Skills Decreased knowledge of use of DME;Pain;Body mechanics;UE functional use;IADL;ROM;Sensation;Decreased knowledge of precautions;ADL   ? Rehab Potential Good   ? Clinical Decision Making Several treatment options, min-mod task modification necessary   ? Comorbidities  Affecting Occupational Performance: Presence of comorbidities impacting occupational performance   ? Modification or Assistance to Complete Evaluation  Min-Moderate modification of tasks or assist with assess necessary to complete eval   ? OT Frequency 2x / week   Will likely decrease frequency to 1x/week depending on progress after initial visits  ? OT Duration 8 weeks   ? OT Treatment/Interventions Self-care/ADL training;Moist Heat;DME and/or AE instruction;Compression bandaging;Therapeutic activities;Ultrasound;Therapeutic exercise;Passive range of motion;Iontophoresis;Cryotherapy;Electrical Stimulation;Manual Therapy;Patient/family education   ? Plan PROM in supine; cold modalities, gentle, deep tissue massage   ? Consulted and Agree with Plan of Care Patient   ? ?  ?  ? ?  ? ?Rosie FateKelsey Aden Sek, MSOT, OTR/L  ?09/30/2021, 9:31 AM ?

## 2021-10-02 ENCOUNTER — Ambulatory Visit: Payer: Medicaid Other | Admitting: Occupational Therapy

## 2021-10-07 ENCOUNTER — Ambulatory Visit: Payer: Medicaid Other | Attending: Physician Assistant | Admitting: Occupational Therapy

## 2021-10-07 ENCOUNTER — Encounter: Payer: Self-pay | Admitting: Occupational Therapy

## 2021-10-07 DIAGNOSIS — R202 Paresthesia of skin: Secondary | ICD-10-CM

## 2021-10-07 DIAGNOSIS — G8929 Other chronic pain: Secondary | ICD-10-CM

## 2021-10-07 DIAGNOSIS — R29898 Other symptoms and signs involving the musculoskeletal system: Secondary | ICD-10-CM

## 2021-10-07 DIAGNOSIS — M25511 Pain in right shoulder: Secondary | ICD-10-CM | POA: Insufficient documentation

## 2021-10-07 DIAGNOSIS — M25611 Stiffness of right shoulder, not elsewhere classified: Secondary | ICD-10-CM | POA: Diagnosis present

## 2021-10-07 NOTE — Therapy (Signed)
?OUTPATIENT OCCUPATIONAL THERAPY TREATMENT NOTE ? ? ?Patient Name: Alicia Ortiz ?MRN: [id number] ?DOB:[birth year], 41 y.o., female ?Today's Date: 10/07/2021 ? ?PCP: [name], MD ?REFERRING PROVIDER: West Bali Persons, PA (ortho) ? ? OT End of Session - 10/07/21 0847   ? ? Visit Number 3   ? Number of Visits 9   ? Date for OT Re-Evaluation 12/22/21   ? Authorization Type [place] Medicaid [employer]   ? Authorization Time Period VL: 27   ? Authorization - Visit Number 2   ? Authorization - Number of Visits 8   ? OT Start Time [phone]   ? OT Stop Time 0930   ? OT Time Calculation (min) 46 min   ? Activity Tolerance Patient limited by pain   ? Behavior During Therapy [facility] for tasks assessed/performed   tangential  ? ?  ?  ? ?  ? ?Past Medical History:  ?Diagnosis Date  ? 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 Hypertension   ? Pregnancy induced hypertension   ? ?Past Surgical History:  ?Procedure Laterality Date  ? BIOPSY BREAST    ? right breast  ? ?Patient Active Problem List  ? Diagnosis Date Noted  ? Pain in right shoulder 09/15/2021  ? NSVD (normal spontaneous vaginal delivery) 09/04/2013  ? Normal labor 09/03/2013  ? Severe Range Gestational hypertension w/o significant proteinuria in 3rd trimester 08/07/2013  ? Deaf 03/28/2013  ? Supervision of high risk pregnancy in third trimester 03/28/2013  ? ? ?ONSET DATE: Chronic ? ?REFERRING DIAG: M25.511,G89.29 (ICD-10-CM) - Chronic right shoulder pain ? ?THERAPY DIAG:  ?Chronic right shoulder pain ? ?Stiffness of right shoulder, not elsewhere classified ? ?Paresthesia of skin ? ?Other symptoms and signs involving the musculoskeletal system ? ? ?PERTINENT HISTORY: Chronic R shoulder pain; hx of hearing impairment and HTN ? ?PRECAUTIONS: None ? ?SUBJECTIVE: Pt states she has not really been able to do her exercises, but has tried massage, ice packs, and attempting to avoid overhead reach as much as possible ? ?PAIN:  ?Are you having pain? ?Pt reports only pain around superior  border of R scapula w/ movement and palpation ? ?OBJECTIVE:  ? ?TODAY'S TREATMENT:  ?Closed-chain BUE shoulder flexion w/ unweighted dowel and pt positioned in supine on mat; completed mid-range AROM 15x slowly to approx head height w/ OT providing visual/tactile cues for arc of motion. Pt reported no pain w/ shoulder flex, but mild pain w/ extension past about 60 degrees from neutral ?R shoulder external rotation ROM w/ pt positioned supine on mat and towel roll under LUE. OT first completed mid-range PROM, which pt was able to tolerate w/out any discomfort or pain; OT then attempted full range PROM, which pt continued to tolerate w/out any difficulty. AROM then completed w/ pt able to achieve full ROM w/out any pain; 15x slowly ?Attempted R shoulder abduction w/ pt in supine and shoulder positioned in neutral for sagittal plane. Pt unable to tolerate PROM or mid-range AROM; attempted mid-range AAROM using arm skateboard w/out pain and pt able to complete 2 sets of 5 w/ rest break between sets ?Attempted soft tissue massage along periscapular muscles, but pt was unable to tolerate even light touch and activity was d/  ? ?        09/30/21 ?Pendulum exercise 5x slowly each direction w/ tabletop support ?Pec stretch w/ pt in supine and towel roll positioned lengthwise along spine ?R shoulder PROM - full ext and int rot w/out pain, but  50% of ext rot and flex ?Cold pack to R shoulder ? ? ?PATIENT EDUCATION: ?Education details: Reviewed recommendations of completing exercises only within pain-free range at this time and continuing to avoid overhead reach, repetitive movements, or heavy lifting ?Person educated: Patient ?Education method: Explanation ?Education comprehension: verbalized understanding ? ? ?HOME EXERCISE PROGRAM ?MedBridge Code: [id number] ?Pendulum circles; pec stretch in supine w/ towel roll; AROM of shoulder ext rot in supine, mid-range closed-chain shoulder flex in supine ? ? OT Short Term Goals  ? ?  ?  OT SHORT TERM GOAL #1  ? Title Pt will verbalize understanding of joint protection and pain management strategies   ? Baseline Decreased knowledge of joint protection   ? Time 4   ? Period Weeks   ? Status On-going   ? Target Date 10/24/21   ?  ? OT SHORT TERM GOAL #2  ? Title Pt will demonstrate understanding of initial HEP designed for gentle ROM   ? Baseline No HEP at this time   ? Time 4   ? Period Weeks   ? Status On-going   ? Target Date 10/24/21   ?  ? OT SHORT TERM GOAL #3  ? Title Pt will be able to move through at least 50% of R shoulder AROM w/ pain less than 5/10   ? Baseline Significant pain w/ greater than 25% of AROM   ? Time 4   ? Period Weeks   ? Status On-going   ? Target Date 10/24/21   ? ?  ?  ? ? OT Long Term Goals  ? ?  ? OT LONG TERM GOAL #1  ? Title Pt will improve FOTO score to at least 49 by d/c   ? Baseline Intake: 32   ? Time 8   ? Period Weeks   ? Status On-going   ? Target Date 11/21/21   ?  ? OT LONG TERM GOAL #2  ? Title Pt will be able to retrieve a medium-weight object (<10 lb) from overhead height within tolerable level of discomfort using RUE and/or BUEs as appropriate   ? Baseline Significant pain w/ functional overhead reach   ? Time 8   ? Period Weeks   ? Status On-going   ? Target Date 11/21/21   ?  ? OT LONG TERM GOAL #3  ? Title Pt will report pain less than 4/10 in at least 3 sessions prior to d/c   ? Baseline 6/10   ? Time 8   ? Period Weeks   ? Status On-going   ? Target Date 11/21/21   ?  ? OT LONG TERM GOAL #4  ? Title Pt to improve R shoulder flexion/scaption strength to at least 3+/5 (complete ROM against gravity/slight resistance)   ? Baseline 3-/5   ? Time 8   ? Period Weeks   ? Status On-going   ? Target Date 11/21/21   ? ?  ?  ? ? Plan  ? ? Clinical Impression Statement Due to pt's activity level and report of decreased pain, OT introduced gentle/mid-range ROM exercises this session, focusing on shoulder flexion and ext rotation. Pt positioned in supine for  exercises to facilitate improved support and better alignment of scapula during exercises w/ OT provided education to pt related to intention and benefit of supine positioning. Pt demonstrated most difficulty w/ shoulder abduction this session, which may indicate supraspinatus involvement. However, pt did report significant pain w/ palpation to levator scapula, rhomboids,  upper trapezius, and deltoid. Considering significance of pain w/ even light touch and difficulty replicating symptoms consistently, OT will continue to assess shoulder pain next session to better identify muscles/muscle groups to target. OT reviewed benefit of attempting some level of consistency w/ exercises at home as well as importance of positional considerations/joint protection in order to maximize benefit of OP therapy; pt verbalized understanding.  ? OT Occupational Profile and History Problem Focused Assessment - Including review of records relating to presenting problem   ? Occupational performance deficits (Please refer to evaluation for details): ADL's;IADL's;Rest and Sleep;Work   ? Body Structure / Function / Physical Skills Decreased knowledge of use of DME;Pain;Body mechanics;UE functional use;IADL;ROM;Sensation;Decreased knowledge of precautions;ADL   ? Rehab Potential Good   ? Clinical Decision Making Several treatment options, min-mod task modification necessary   ? Comorbidities Affecting Occupational Performance: Presence of comorbidities impacting occupational performance   ? Modification or Assistance to Complete Evaluation  Min-Moderate modification of tasks or assist with assess necessary to complete eval   ? OT Frequency 1x / week   Due to insurance  ? OT Duration 8 weeks   ? OT Treatment/Interventions Self-care/ADL training;Moist Heat;DME and/or AE instruction;Compression bandaging;Therapeutic activities;Ultrasound;Therapeutic exercise;Passive range of motion;Iontophoresis;Cryotherapy;Electrical Stimulation;Manual  Therapy;Patient/family education   ? Plan Reassess shoulder, particularly subscapularis/teres minor  ? Consulted and Agree with Plan of Care Patient   ? ?  ?  ? ?Rosie Fate, MSOT, OTR/L  ?10/07/2021, 12:56 PM ?

## 2021-10-09 ENCOUNTER — Ambulatory Visit: Payer: Medicaid Other | Admitting: Occupational Therapy

## 2021-10-14 ENCOUNTER — Ambulatory Visit: Payer: Medicaid Other | Admitting: Occupational Therapy

## 2021-10-21 ENCOUNTER — Ambulatory Visit: Payer: Medicaid Other | Admitting: Occupational Therapy

## 2022-04-13 ENCOUNTER — Ambulatory Visit
Admission: EM | Admit: 2022-04-13 | Discharge: 2022-04-13 | Disposition: A | Discharge status: Home / Self Care | Payer: Medicaid Other | Attending: Physician Assistant | Admitting: Physician Assistant

## 2022-04-13 DIAGNOSIS — J209 Acute bronchitis, unspecified: Secondary | ICD-10-CM | POA: Diagnosis not present

## 2022-04-13 MED ORDER — BENZONATATE 100 MG PO CAPS: 100.0000 mg | ORAL_CAPSULE | Freq: Three times a day (TID) | ORAL | 0 refills | Status: AC

## 2022-04-13 MED ORDER — ALBUTEROL SULFATE HFA 108 (90 BASE) MCG/ACT IN AERS: 1.0000 | INHALATION_SPRAY | Freq: Four times a day (QID) | RESPIRATORY_TRACT | 0 refills | Status: DC | PRN

## 2022-04-13 MED ORDER — PREDNISONE 20 MG PO TABS: 40.0000 mg | ORAL_TABLET | Freq: Every day | ORAL | 0 refills | Status: AC

## 2022-04-13 NOTE — ED Triage Notes (Signed)
Pt presents with non productive cough, nasal drainage, and congestion X 1 week. 

## 2022-04-13 NOTE — ED Provider Notes (Signed)
[facility]    CSN: [id number] Arrival date & time: 04/13/22  0347      History   Chief Complaint Chief Complaint  Patient presents with   URI    HPI Alicia Ortiz is a 41 y.o. female.   Patient here today for evaluation of nonproductive cough, nasal drainage and congestion she has had for the last week.  She has not had fever.  She denies any sore throat.  She has tried taking an over-the-counter medication without significant relief.  The history is provided by the patient.  URI Presenting symptoms: congestion and cough   Presenting symptoms: no ear pain, no fever and no sore throat   Associated symptoms: no wheezing     Past Medical History:  Diagnosis Date   Deaf    earing aids, reads lips   Hypertension    Pregnancy induced hypertension     Patient Active Problem List   Diagnosis Date Noted   Pain in right shoulder 09/15/2021   NSVD (normal spontaneous vaginal delivery) 09/04/2013   Normal labor 09/03/2013   Severe Range Gestational hypertension w/o significant proteinuria in 3rd trimester 08/07/2013   Deaf 03/28/2013   Supervision of high risk pregnancy in third trimester 03/28/2013    Past Surgical History:  Procedure Laterality Date   BIOPSY BREAST     right breast    OB History     Gravida  3   Para  2   Term  2   Preterm  0   AB  1   Living  2      SAB  1   IAB  0   Ectopic  0   Multiple  0   Live Births  2            Home Medications    Prior to Admission medications   Medication Sig Start Date End Date Taking? Authorizing Provider  benzonatate (TESSALON) 100 MG capsule Take 1 capsule (100 mg total) by mouth every 8 (eight) hours. 04/13/22  Yes Tomi Bamberger, PA-C  predniSONE (DELTASONE) 20 MG tablet Take 2 tablets (40 mg total) by mouth daily with breakfast for 5 days. 04/13/22 04/18/22 Yes Tomi Bamberger, PA-C  amLODipine (NORVASC) 5 MG tablet Take 1 tablet (5 mg total) by mouth daily. 09/18/13    Constant, Peggy, MD  ferrous sulfate (FERROUSUL) 325 (65 FE) MG tablet Take 1 tablet (325 mg total) by mouth 2 (two) times daily. 09/18/13   Constant, Peggy, MD  hydrochlorothiazide (HYDRODIURIL) 25 MG tablet Take 1 tablet (25 mg total) by mouth daily. 09/14/13   Adam Phenix, MD  lisinopril (ZESTRIL) 10 MG tablet Take 1 tablet (10 mg total) by mouth daily. 06/04/21 07/04/21  Tomi Bamberger, PA-C  meloxicam (MOBIC) 15 MG tablet Take 1 tablet (15 mg total) by mouth daily. 09/15/21   Persons, [place], PA  Prenatal Vit-Fe Fumarate-FA (PRENATAL MULTIVITAMIN) TABS tablet Take 1 tablet by mouth daily at 12 noon. 03/28/13   Lesly Dukes, MD    Family History Family History  Problem Relation Age of Onset   Stroke Mother    Hypertension Mother    Hyperlipidemia Mother    Cancer Daughter        leaukemia    Social History Social History   Tobacco Use   Smoking status: Former    Packs/day: 0.15    Types: Cigarettes    Quit date: 08/24/2013    Years since quitting:  8.6   Smokeless tobacco: Never  Substance Use Topics   Alcohol use: No   Drug use: No     Allergies   Sulfa antibiotics   Review of Systems Review of Systems  Constitutional:  Negative for chills and fever.  HENT:  Positive for congestion. Negative for ear pain and sore throat.   Eyes:  Negative for discharge and redness.  Respiratory:  Positive for cough. Negative for shortness of breath and wheezing.   Gastrointestinal:  Negative for abdominal pain, diarrhea, nausea and vomiting.     Physical Exam Triage Vital Signs ED Triage Vitals  Enc Vitals Group     BP 04/13/22 1007 (!) 146/84     Pulse Rate 04/13/22 1007 81     Resp 04/13/22 1007 18     Temp 04/13/22 1007 97.8 F (36.6 C)     Temp Source 04/13/22 1007 Oral     SpO2 04/13/22 1007 98 %     Weight --      Height --      Head Circumference --      Peak Flow --      Pain Score 04/13/22 1011 0     Pain Loc --      Pain Edu? --      Excl. in GC?  --    No data found.  Updated Vital Signs BP (!) 146/84 (BP Location: Right Arm)   Pulse 81   Temp 97.8 F (36.6 C) (Oral)   Resp 18   LMP  (LMP Unknown)   SpO2 98%      Physical Exam Vitals and nursing note reviewed.  Constitutional:      General: She is not in acute distress.    Appearance: Normal appearance. She is not ill-appearing.  HENT:     Head: Normocephalic and atraumatic.     Right Ear: Tympanic membrane normal.     Left Ear: Tympanic membrane normal.     Nose: Congestion present.     Mouth/Throat:     Mouth: Mucous membranes are moist.     Pharynx: No oropharyngeal exudate or posterior oropharyngeal erythema.  Eyes:     Conjunctiva/sclera: Conjunctivae normal.  Cardiovascular:     Rate and Rhythm: Normal rate and regular rhythm.     Heart sounds: Normal heart sounds. No murmur heard. Pulmonary:     Effort: Pulmonary effort is normal. No respiratory distress.     Breath sounds: Normal breath sounds. No wheezing, rhonchi or rales.  Skin:    General: Skin is warm and dry.  Neurological:     Mental Status: She is alert.  Psychiatric:        Mood and Affect: Mood normal.        Thought Content: Thought content normal.      UC Treatments / Results  Labs (all labs ordered are listed, but only abnormal results are displayed) Labs Reviewed - No data to display  EKG   Radiology No results found.  Procedures Procedures (including critical care time)  Medications Ordered in UC Medications - No data to display  Initial Impression / Assessment and Plan / UC Course  I have reviewed the triage vital signs and the nursing notes.  Pertinent labs & imaging results that were available during my care of the patient were reviewed by me and considered in my medical decision making (see chart for details).    Steroid burst and Tessalon Perles prescribed for suspected bronchitis.  Recommended follow-up if  no gradual improvement or with any further  concerns.  Final Clinical Impressions(s) / UC Diagnoses   Final diagnoses:  Acute bronchitis, unspecified organism   Discharge Instructions   None    ED Prescriptions     Medication Sig Dispense Auth. Provider   predniSONE (DELTASONE) 20 MG tablet Take 2 tablets (40 mg total) by mouth daily with breakfast for 5 days. 10 tablet Ewell Poe F, PA-C   benzonatate (TESSALON) 100 MG capsule Take 1 capsule (100 mg total) by mouth every 8 (eight) hours. 21 capsule Francene Finders, PA-C      PDMP not reviewed this encounter.   Francene Finders, PA-C 04/13/22 1151

## 2022-07-10 ENCOUNTER — Ambulatory Visit
Admission: EM | Admit: 2022-07-10 | Discharge: 2022-07-10 | Disposition: A | Discharge status: Home / Self Care | Payer: Medicaid Other | Attending: Nurse Practitioner | Admitting: Nurse Practitioner

## 2022-07-10 ENCOUNTER — Ambulatory Visit (INDEPENDENT_AMBULATORY_CARE_PROVIDER_SITE_OTHER): Payer: Medicaid Other

## 2022-07-10 DIAGNOSIS — M79674 Pain in right toe(s): Secondary | ICD-10-CM

## 2022-07-10 DIAGNOSIS — H1013 Acute atopic conjunctivitis, bilateral: Secondary | ICD-10-CM | POA: Diagnosis not present

## 2022-07-10 DIAGNOSIS — M19079 Primary osteoarthritis, unspecified ankle and foot: Secondary | ICD-10-CM

## 2022-07-10 MED ORDER — PREDNISONE 20 MG PO TABS: 40.0000 mg | ORAL_TABLET | Freq: Every day | ORAL | 0 refills | Status: AC

## 2022-07-10 NOTE — ED Provider Notes (Signed)
[facility]    CSN: [id number] Arrival date & time: 07/10/22  1104      History   Chief Complaint Chief Complaint  Patient presents with   Eye Drainage   right toe pain     HPI Alicia Ortiz is a 42 y.o. female for evaluation of an injury to her right great toe x 2 months.  Is any known injury or inciting event.  Reports pain to the the right great toe that occasionally shoots up to the distal toe.  This is intermittent and primarily occurs with weightbearing.  Denies any bruising, swelling, numbness/tingling.  She has not taken any OTC medications for symptoms.  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 She reports 2 months of bilateral intermittent eye redness and drainage that occurs primarily after she ends her work shift and goes home.  States when she wakes up in the morning she has yellow crusty drainage from the eyes but this resolves and does not return until she goes back to work.  Reports sometimes she will have blurry vision with itchy eyes as well, but denies any eye pain.  She is supposed to wear glasses but does not do this.  She has seen ophthalmology in the past secondary to what sounds like a family history of retinal detachment and states "everything is good".  Denies any URI symptoms or injury to the eye.  No photosensitivity or foreign body sensation.  She states she does not use over-the-counter medications because none of them worked for her. No other concerns at this time.     HPI  Past Medical History:  Diagnosis Date   Deaf    earing aids, reads lips   Hypertension    Pregnancy induced hypertension     Patient Active Problem List   Diagnosis Date Noted   Pain in right shoulder 09/15/2021   NSVD (normal spontaneous vaginal delivery) 09/04/2013   Normal labor 09/03/2013   Severe Range Gestational hypertension w/o significant proteinuria in 3rd  trimester 08/07/2013   Deaf 03/28/2013   Supervision of high risk pregnancy in third trimester 03/28/2013    Past Surgical History:  Procedure Laterality Date   BIOPSY BREAST     right breast    OB History     Gravida  3   Para  2   Term  2   Preterm  0   AB  1   Living  2      SAB  1   IAB  0   Ectopic  0   Multiple  0   Live Births  2            Home Medications    Prior to Admission medications   Medication Sig Start Date End Date Taking? Authorizing Provider  predniSONE (DELTASONE) 20 MG tablet Take 2 tablets (40 mg total) by mouth daily with breakfast for 5 days. 07/10/22 07/15/22 Yes Melynda Ripple, NP  albuterol (VENTOLIN HFA) 108 (90 Base) MCG/ACT inhaler Inhale 1-2 puffs into the lungs every 6 (six) hours as needed for wheezing or shortness of breath. 04/13/22   Francene Finders, PA-C  amLODipine (NORVASC) 5 MG tablet Take 1 tablet (5 mg total) by mouth daily. 09/18/13   Constant, Peggy, MD  benzonatate (TESSALON) 100 MG capsule Take 1 capsule (100 mg total) by mouth every 8 (eight) hours. 04/13/22  Tomi Bamberger, PA-C  ferrous sulfate (FERROUSUL) 325 (65 FE) MG tablet Take 1 tablet (325 mg total) by mouth 2 (two) times daily. 09/18/13   Constant, Peggy, MD  hydrochlorothiazide (HYDRODIURIL) 25 MG tablet Take 1 tablet (25 mg total) by mouth daily. 09/14/13   Adam Phenix, MD  lisinopril (ZESTRIL) 10 MG tablet Take 1 tablet (10 mg total) by mouth daily. 06/04/21 07/04/21  Tomi Bamberger, PA-C  meloxicam (MOBIC) 15 MG tablet Take 1 tablet (15 mg total) by mouth daily. 09/15/21   Persons, [place], PA  Prenatal Vit-Fe Fumarate-FA (PRENATAL MULTIVITAMIN) TABS tablet Take 1 tablet by mouth daily at 12 noon. 03/28/13   Lesly Dukes, MD    Family History Family History  Problem Relation Age of Onset   Stroke Mother    Hypertension Mother    Hyperlipidemia Mother    Cancer Daughter        leaukemia    Social History Social History   Tobacco  Use   Smoking status: Former    Packs/day: 0.15    Types: Cigarettes    Quit date: 08/24/2013    Years since quitting: 8.8   Smokeless tobacco: Never  Substance Use Topics   Alcohol use: No   Drug use: No     Allergies   Sulfa antibiotics   Review of Systems Review of Systems  Eyes:  Positive for discharge and redness.  Musculoskeletal:        Right great toe pain      Physical Exam Triage Vital Signs ED Triage Vitals  Enc Vitals Group     BP 07/10/22 1147 (!) 144/85     Pulse Rate 07/10/22 1147 83     Resp 07/10/22 1147 19     Temp 07/10/22 1147 97.6 F (36.4 C)     Temp src --      SpO2 07/10/22 1147 98 %     Weight --      Height --      Head Circumference --      Peak Flow --      Pain Score 07/10/22 1146 8     Pain Loc --      Pain Edu? --      Excl. in GC? --    No data found.  Updated Vital Signs BP (!) 144/85   Pulse 83   Temp 97.6 F (36.4 C)   Resp 19   LMP 07/02/2022   SpO2 98%   Visual Acuity Right Eye Distance:   Left Eye Distance:   Bilateral Distance:    Right Eye Near:   Left Eye Near:    Bilateral Near:     Physical Exam Vitals and nursing note reviewed.  Constitutional:      Appearance: Normal appearance.  HENT:     Head: Normocephalic and atraumatic.  Eyes:     General: Lids are normal.        Right eye: No discharge or hordeolum.        Left eye: No discharge or hordeolum.     Extraocular Movements: Extraocular movements intact.     Right eye: No nystagmus.     Left eye: No nystagmus.     Conjunctiva/sclera: Conjunctivae normal.     Right eye: Right conjunctiva is not injected. No chemosis, exudate or hemorrhage.    Left eye: Left conjunctiva is not injected. No chemosis, exudate or hemorrhage.    Pupils: Pupils are equal, round, and reactive  to light.  Cardiovascular:     Rate and Rhythm: Normal rate.  Pulmonary:     Effort: Pulmonary effort is normal.  Musculoskeletal:       Feet:  Skin:    General: Skin is  warm and dry.  Neurological:     General: No focal deficit present.     Mental Status: She is alert and oriented to person, place, and time.  Psychiatric:        Mood and Affect: Mood normal.        Behavior: Behavior normal.      UC Treatments / Results  Labs (all labs ordered are listed, but only abnormal results are displayed) Labs Reviewed - No data to display  EKG   Radiology DG Toe Great Right  Result Date: 07/10/2022 CLINICAL DATA:  Pain without known injury. EXAM: RIGHT GREAT TOE COMPARISON:  None Available. FINDINGS: No fracture, dislocation, or bony erosion. A soft tissue calcification is identified lateral to the proximal aspect of the first proximal phalanx. No other abnormalities are identified. IMPRESSION: 1. A small soft tissue calcification is identified just medial to the proximal aspect of the first proximal phalanx. This is likely a nonacute soft tissue calcification of doubtful significance. 2. No acute bony abnormalities are identified. Electronically Signed   By: Dorise Bullion III M.D.   On: 07/10/2022 12:21    Procedures Procedures (including critical care time)  Medications Ordered in UC Medications - No data to display  Initial Impression / Assessment and Plan / UC Course  I have reviewed the triage vital signs and the nursing notes.  Pertinent labs & imaging results that were available during my care of the patient were reviewed by me and considered in my medical decision making (see chart for details).     Reviewed exam and symptoms with patient. Discussed eye symptoms and exam consistent with likely allergic conjunctivitis.  Advised OTC antihistamine eyedrops and allergy medication such as Claritin or Zyrtec Continue warm compresses to the eye as needed Advised ophthalmology follow-up if symptoms do not improve Reviewed xray with patient. No fx, likely arthritis. 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  This is not contagious.  I would advise you use over-the-counter antihistamine eyedrop such as Clear Eyes take an oral allergy medication such as Zyrtec or Claritin daily for at least 2 weeks.  You may continue warm compresses.  If your symptoms do not improve please see ophthalmology  Start prednisone for 5 days. if your symptoms do not improve with this treatment please follow-up with podiatrist or PCP     ED Prescriptions     Medication Sig Dispense Auth. Provider   predniSONE (DELTASONE) 20 MG tablet Take 2 tablets (40 mg total) by mouth daily with breakfast for 5 days. 10 tablet Melynda Ripple, NP      PDMP not reviewed this encounter.   Melynda Ripple, NP 07/10/22 1234

## 2022-07-10 NOTE — ED Triage Notes (Signed)
Pt presents to uc with co of right and left eye drainage  for one month.  Pt aslo co of right toe pain for 2 months. 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 Pt repots no otc medications.

## 2022-07-10 NOTE — Discharge Instructions (Addendum)
Your exam and symptoms appear consistent with an allergic conjunctivitis.  This is not contagious.  I would advise you use over-the-counter antihistamine eyedrop such as Clear Eyes take an oral allergy medication such as Zyrtec or Claritin daily for at least 2 weeks.  You may continue warm compresses.  If your symptoms do not improve please see ophthalmology  Start prednisone for 5 days. if your symptoms do not improve with this treatment please follow-up with podiatrist or PCP

## 2022-10-29 ENCOUNTER — Other Ambulatory Visit: Payer: Self-pay | Admitting: Family Medicine

## 2022-10-29 DIAGNOSIS — Z1231 Encounter for screening mammogram for malignant neoplasm of breast: Secondary | ICD-10-CM

## 2022-12-04 ENCOUNTER — Ambulatory Visit: Payer: Medicaid Other

## 2023-09-28 ENCOUNTER — Ambulatory Visit: Admission: EM | Admit: 2023-09-28 | Discharge: 2023-09-28 | Disposition: A | Discharge status: Home / Self Care

## 2023-09-28 DIAGNOSIS — J0110 Acute frontal sinusitis, unspecified: Secondary | ICD-10-CM

## 2023-09-28 DIAGNOSIS — R21 Rash and other nonspecific skin eruption: Secondary | ICD-10-CM | POA: Diagnosis not present

## 2023-09-28 DIAGNOSIS — J209 Acute bronchitis, unspecified: Secondary | ICD-10-CM | POA: Diagnosis not present

## 2023-09-28 MED ORDER — TRIAMCINOLONE ACETONIDE 0.1 % EX CREA: 1.0000 | TOPICAL_CREAM | Freq: Two times a day (BID) | CUTANEOUS | 0 refills | Status: AC | PRN

## 2023-09-28 MED ORDER — ALBUTEROL SULFATE HFA 108 (90 BASE) MCG/ACT IN AERS
1.0000 | INHALATION_SPRAY | Freq: Four times a day (QID) | RESPIRATORY_TRACT | 0 refills | Status: AC | PRN
Start: 2023-09-28 — End: ?

## 2023-09-28 MED ORDER — AMOXICILLIN 875 MG PO TABS: 875.0000 mg | ORAL_TABLET | Freq: Two times a day (BID) | ORAL | 0 refills | Status: AC

## 2023-09-28 MED ORDER — PREDNISONE 20 MG PO TABS: 40.0000 mg | ORAL_TABLET | Freq: Every day | ORAL | 0 refills | Status: AC

## 2023-09-28 NOTE — ED Provider Notes (Signed)
 [facility]    CSN: [id number] Arrival date & time: 09/28/23  1442      History   Chief Complaint Chief Complaint  Patient presents with   Nasal Congestion   Cough   Otalgia    HPI Alicia Ortiz is a 43 y.o. female.   Patient here today for evaluation of cough, ear pain, nasal congestion for over a week.  Reports that the ear pain feels like her ears are stopped up.  Endorses a productive cough.  Denies any shortness of breath however endorses increased work of breathing when she lays down it feels like her chest is full of congestion.  According to patient she has not had a fever no known exposure to anyone sick.  Has taken over-the-counter medication without any improvement of symptoms.  Patient here with multiple bumps on her upper and lower extremities.  She reports they come and go.  They do have animals in the home but she reports she does not have carpet so does not believe that she has been exposed to any fleas.  Denies any history of dermatitis or any chronic skin conditions.  Does not use any over-the-counter medication for treatment of symptoms.  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   Sensorineural hearing loss (SNHL), bilateral 03/19/2016   NSVD (normal spontaneous vaginal delivery) 09/04/2013   Normal labor 09/03/2013   Severe Range Gestational hypertension w/o significant proteinuria in 3rd trimester 08/07/2013   Deaf 03/28/2013   Supervision of high risk pregnancy in third trimester 03/28/2013    Past Surgical History:  Procedure Laterality Date   BIOPSY BREAST     right breast    OB History     Gravida  3   Para  2   Term  2   Preterm  0   AB  1   Living  2      SAB  1   IAB  0    Ectopic  0   Multiple  0   Live Births  2            Home Medications    Prior to Admission medications   Medication Sig Start Date End Date Taking? Authorizing Provider  amoxicillin (AMOXIL) 875 MG tablet Take 1 tablet (875 mg total) by mouth 2 (two) times daily. 09/28/23  Yes Bing Neighbors, NP  benzonatate (TESSALON) 100 MG capsule Take 100 mg by mouth 3 (three) times daily as needed. 05/21/16  Yes [provider]  ciprofloxacin-dexamethasone (CIPRODEX) OTIC suspension Place 4 drops into both ears 2 (two) times daily. 05/19/16  Yes [provider]  fluticasone (FLONASE) 50 MCG/ACT nasal spray Place 1 spray into both nostrils daily. 05/21/16  Yes [provider]  lisinopril (ZESTRIL) 10 MG tablet Take 1 tablet (10 mg total) by mouth daily. 06/04/21 09/28/23 Yes Tomi Bamberger, PA-C  lisinopril (ZESTRIL) 5 MG tablet Take 1 tablet by mouth daily. 04/15/16  Yes [provider]  predniSONE (DELTASONE) 20 MG tablet Take 2 tablets (40 mg total) by mouth daily with breakfast. 09/28/23  Yes Bing Neighbors, NP  sertraline (ZOLOFT) 50 MG tablet Take 50 mg by mouth daily. 04/10/16  Yes [provider]  triamcinolone cream (KENALOG) 0.1 % Apply 1 Application topically 2 (two) times daily as needed (itching rash). 09/28/23  Yes Bing Neighbors, NP  albuterol (VENTOLIN HFA) 108 (90 Base) MCG/ACT inhaler Inhale 1-2 puffs into the lungs every 6 (six) hours as needed for wheezing or shortness of breath. 09/28/23   Bing Neighbors, NP  benzonatate (TESSALON) 100 MG capsule Take 1 capsule (100 mg total) by mouth every 8 (eight) hours. 04/13/22   Tomi Bamberger, PA-C  meloxicam (MOBIC) 15 MG tablet Take 1 tablet (15 mg total) by mouth daily. 09/15/21   Persons, [place], PA    Family History Family History  Problem Relation Age of Onset   Stroke Mother    Hypertension Mother    Hyperlipidemia Mother    Cancer Daughter        leaukemia     Social History Social History   Tobacco Use   Smoking status: Former    Current packs/day: 0.00    Types: Cigarettes    Quit date: 08/24/2013    Years since quitting: 10.1   Smokeless tobacco: Never  Vaping Use   Vaping status: Never Used  Substance Use Topics   Alcohol use: Not Currently   Drug use: Not Currently     Allergies   Sulfa antibiotics and Sulfasalazine   Review of Systems Review of Systems  HENT:  Positive for ear pain.   Respiratory:  Positive for cough.      Physical Exam Triage Vital Signs ED Triage Vitals  Encounter Vitals Group     BP 09/28/23 1501 123/74     Systolic BP Percentile --      Diastolic BP Percentile --      Pulse Rate 09/28/23 1501 81     Resp 09/28/23 1501 20     Temp 09/28/23 1501 97.9 F (36.6 C)     Temp Source 09/28/23 1501 Oral     SpO2 09/28/23 1501 99 %     Weight 09/28/23 1459 145 lb 15.1 oz (66.2 kg)     Height 09/28/23 1459 5\' 2"  (1.575 m)     Head Circumference --      Peak Flow --      Pain Score 09/28/23 1459 0     Pain Loc --      Pain Education --      Exclude from Growth Chart --    No data found.  Updated Vital Signs BP 123/74 (BP Location: Left Arm)   Pulse 81   Temp 97.9 F (36.6 C) (Oral)   Resp 20   Ht 5\' 2"  (1.575 m)   Wt 145 lb 15.1 oz (66.2 kg)   LMP 09/21/2023 (Exact Date)   SpO2 99%   BMI 26.69 kg/m   Visual Acuity Right Eye Distance:   Left Eye Distance:   Bilateral Distance:    Right Eye Near:   Left Eye Near:    Bilateral Near:     Physical Exam Vitals reviewed.  Constitutional:      Appearance: Normal appearance.  HENT:     Head: Normocephalic and atraumatic.     Right Ear: Tympanic membrane, ear canal and external ear normal. There is no impacted cerumen.     Left Ear: Tympanic membrane, ear canal and external ear normal. There is no impacted cerumen.     Nose: Congestion and rhinorrhea present.     Mouth/Throat:     Pharynx: Postnasal drip present. No  oropharyngeal exudate or  posterior oropharyngeal erythema.     Tonsils: No tonsillar exudate or tonsillar abscesses.  Eyes:     Extraocular Movements: Extraocular movements intact.     Conjunctiva/sclera: Conjunctivae normal.     Pupils: Pupils are equal, round, and reactive to light.  Cardiovascular:     Rate and Rhythm: Normal rate and regular rhythm.  Pulmonary:     Breath sounds: Rhonchi present.  Musculoskeletal:     Cervical back: Normal range of motion and neck supple.  Lymphadenopathy:     Cervical: Cervical adenopathy present.  Skin:    Findings: Erythema and rash present. Rash is papular.  Neurological:     General: No focal deficit present.     Mental Status: She is alert.      UC Treatments / Results  Labs (all labs ordered are listed, but only abnormal results are displayed) Labs Reviewed - No data to display  EKG   Radiology No results found.  Procedures Procedures (including critical care time)  Medications Ordered in UC Medications - No data to display  Initial Impression / Assessment and Plan / UC Course  I have reviewed the triage vital signs and the nursing notes.  Pertinent labs & imaging results that were available during my care of the patient were reviewed by me and considered in my medical decision making (see chart for details).    1. Acute non-recurrent frontal sinusitis (Primary), empiric treatment for uncomplicated sinusitis - amoxicillin (AMOXIL) 875 MG tablet; Take 1 tablet (875 mg total) by mouth 2 (two) times daily.  Dispense: 20 tablet; Refill: 0   2. Acute bronchitis, unspecified organism., uncomplicated  - benzonatate (TESSALON) 100 MG capsule; Take 100 mg by mouth 3 (three) times daily as needed. - predniSONE (DELTASONE) 20 MG tablet; Take 2 tablets (40 mg total) by mouth daily with breakfast.  Dispense: 10 tablet; Refill: 0 - albuterol (VENTOLIN HFA) 108 (90 Base) MCG/ACT inhaler; Inhale 1-2 puffs into the lungs every 6 (six)  hours as needed for wheezing or shortness of breath.  Dispense: 8 g; Refill: 0  3. Rash and nonspecific skin eruption, unknown etiology - triamcinolone cream (KENALOG) 0.1 %; Apply 1 Application topically 2 (two) times daily as needed (itching rash).  Dispense: 80 g; Refill: 0 Follow-up with primary care doctor return for evaluation if symptoms have not resolved after completing treatment.  If symptoms become severe go immediately to the emergency department.  Final Clinical Impressions(s) / UC Diagnoses   Final diagnoses:  Acute non-recurrent frontal sinusitis  Acute bronchitis, unspecified organism  Rash and nonspecific skin eruption   Discharge Instructions   None    ED Prescriptions     Medication Sig Dispense Auth. Provider   predniSONE (DELTASONE) 20 MG tablet Take 2 tablets (40 mg total) by mouth daily with breakfast. 10 tablet Bing Neighbors, NP   amoxicillin (AMOXIL) 875 MG tablet Take 1 tablet (875 mg total) by mouth 2 (two) times daily. 20 tablet Bing Neighbors, NP   triamcinolone cream (KENALOG) 0.1 % Apply 1 Application topically 2 (two) times daily as needed (itching rash). 80 g Bing Neighbors, NP   albuterol (VENTOLIN HFA) 108 (90 Base) MCG/ACT inhaler Inhale 1-2 puffs into the lungs every 6 (six) hours as needed for wheezing or shortness of breath. 8 g Bing Neighbors, NP      PDMP not reviewed this encounter.   Bing Neighbors, NP 10/04/23 [phone]

## 2023-09-28 NOTE — ED Triage Notes (Signed)
"  This started with a Cough about a month ago and then became more productive, nasal congestion/stuffiness, at night when lying down I can't catch my breath due cough/congestion in my chest". "My ears are stopped up maybe they have fluid in them". "I also have a few spots/bites on my arms that I want checked out".

## 2023-11-19 ENCOUNTER — Encounter: Payer: Self-pay | Admitting: Emergency Medicine

## 2023-11-19 ENCOUNTER — Ambulatory Visit
Admission: EM | Admit: 2023-11-19 | Discharge: 2023-11-19 | Disposition: A | Discharge status: Home / Self Care | Attending: Family Medicine | Admitting: Family Medicine

## 2023-11-19 DIAGNOSIS — J00 Acute nasopharyngitis [common cold]: Secondary | ICD-10-CM | POA: Diagnosis not present

## 2023-11-19 DIAGNOSIS — T148XXA Other injury of unspecified body region, initial encounter: Secondary | ICD-10-CM | POA: Diagnosis not present

## 2023-11-19 MED ORDER — LEVOCETIRIZINE DIHYDROCHLORIDE 5 MG PO TABS: 5.0000 mg | ORAL_TABLET | Freq: Every evening | ORAL | 2 refills | Status: AC

## 2023-11-19 MED ORDER — FLUTICASONE PROPIONATE 50 MCG/ACT NA SUSP: 2.0000 | Freq: Every day | NASAL | 0 refills | Status: AC

## 2023-11-19 NOTE — ED Provider Notes (Signed)
 [facility]    CSN: [id number] Arrival date & time: 11/19/23  0932      History   Chief Complaint Chief Complaint  Patient presents with   Otalgia   Nasal Congestion   Chest congestion    HPI PENNI Alicia Ortiz is a 43 y.o. female.  Per triage note patient is here with a 1 day history of otalgia, ear fullness, nasal and chest congestion x 1 day.   Patient reports when she blew her nose parents in the sensation that her ear was full.  No other symptoms. Patient has previously been prescribed and recommended to take medication for seasonal allergies however she reports she does not take these medicines because they do not work.  Educated that she has to take the medications continuously and not on a as needed basis to treat her allergy. 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   Sensorineural hearing loss (SNHL), bilateral 03/19/2016   NSVD (normal spontaneous vaginal delivery) 09/04/2013   Normal labor 09/03/2013   Severe Range Gestational hypertension w/o significant proteinuria in 3rd trimester 08/07/2013   Deaf 03/28/2013   Supervision of high risk pregnancy in third trimester 03/28/2013    Past Surgical History:  Procedure Laterality Date   BIOPSY BREAST     right breast    OB History     Gravida  3   Para  2   Term  2   Preterm  0   AB  1   Living  2      SAB  1   IAB  0   Ectopic  0   Multiple  0   Live Births  2            Home Medications    Prior to Admission medications   Medication Sig Start Date End Date Taking? Authorizing Provider  fluticasone  (FLONASE ) 50 MCG/ACT nasal spray Place 2 sprays into both nostrils daily. 11/19/23  Yes Buena Carmine, NP  levocetirizine (XYZAL ) 5 MG  tablet Take 1 tablet (5 mg total) by mouth every evening. 11/19/23 02/17/24 Yes Buena Carmine, NP  albuterol  (VENTOLIN  HFA) 108 (90 Base) MCG/ACT inhaler Inhale 1-2 puffs into the lungs every 6 (six) hours as needed for wheezing or shortness of breath. 09/28/23   Buena Carmine, NP  amoxicillin  (AMOXIL ) 875 MG tablet Take 1 tablet (875 mg total) by mouth 2 (two) times daily. 09/28/23   Buena Carmine, NP  benzonatate  (TESSALON ) 100 MG capsule Take 1 capsule (100 mg total) by mouth every 8 (eight) hours. 04/13/22   Vernestine Gondola, PA-C  benzonatate  (TESSALON ) 100 MG capsule Take 100 mg by mouth 3 (three) times daily as needed. 05/21/16   [provider]  ciprofloxacin-dexamethasone (CIPRODEX) OTIC suspension Place 4 drops into both ears 2 (two) times daily. 05/19/16   [provider]  lisinopril  (ZESTRIL ) 10 MG tablet Take 1 tablet (10 mg total) by mouth daily. 06/04/21 09/28/23  Vernestine Gondola, PA-C  lisinopril  (ZESTRIL ) 5 MG tablet Take 1 tablet by mouth daily. 04/15/16   [provider]  meloxicam  (MOBIC ) 15 MG tablet Take 1 tablet (15 mg total) by mouth daily. 09/15/21   Persons, Norma Beckers,  PA  predniSONE  (DELTASONE ) 20 MG tablet Take 2 tablets (40 mg total) by mouth daily with breakfast. 09/28/23   Buena Carmine, NP  sertraline (ZOLOFT) 50 MG tablet Take 50 mg by mouth daily. 04/10/16   [provider]  triamcinolone  cream (KENALOG ) 0.1 % Apply 1 Application topically 2 (two) times daily as needed (itching rash). 09/28/23   Buena Carmine, NP    Family History Family History  Problem Relation Age of Onset   Stroke Mother    Hypertension Mother    Hyperlipidemia Mother    Cancer Daughter        leaukemia    Social History Social History   Tobacco Use   Smoking status: Former    Current packs/day: 0.00    Types: Cigarettes    Quit date: 08/24/2013    Years since quitting: 10.2   Smokeless tobacco: Never  Vaping Use   Vaping  status: Never Used  Substance Use Topics   Alcohol use: Not Currently   Drug use: Not Currently     Allergies   Sulfa antibiotics and Sulfasalazine   Review of Systems Review of Systems  HENT:  Positive for ear pain.      Physical Exam Triage Vital Signs ED Triage Vitals  Encounter Vitals Group     BP 11/19/23 1044 (!) 168/90     Systolic BP Percentile --      Diastolic BP Percentile --      Pulse Rate 11/19/23 1044 67     Resp 11/19/23 1044 18     Temp 11/19/23 1044 98.3 F (36.8 C)     Temp Source 11/19/23 1043 Oral     SpO2 11/19/23 1044 98 %     Weight 11/19/23 1038 145 lb 15.1 oz (66.2 kg)     Height --      Head Circumference --      Peak Flow --      Pain Score 11/19/23 1036 5     Pain Loc --      Pain Education --      Exclude from Growth Chart --    No data found.  Updated Vital Signs BP (!) 168/90 (BP Location: Right Arm)   Pulse 67   Temp 98.3 F (36.8 C) (Oral)   Resp 18   Wt 145 lb 15.1 oz (66.2 kg)   LMP 10/24/2023 (Approximate)   SpO2 98%   BMI 26.69 kg/m   Visual Acuity Right Eye Distance:   Left Eye Distance:   Bilateral Distance:    Right Eye Near:   Left Eye Near:    Bilateral Near:     Physical Exam Vitals reviewed.  Constitutional:      Appearance: Normal appearance.  HENT:     Head: Normocephalic.     Nose: Congestion and rhinorrhea present.  Eyes:     Extraocular Movements: Extraocular movements intact.  Cardiovascular:     Rate and Rhythm: Normal rate and regular rhythm.  Pulmonary:     Effort: Pulmonary effort is normal.     Breath sounds: Normal breath sounds.  Skin:    General: Skin is warm and dry.  Neurological:     General: No focal deficit present.     Mental Status: She is alert.      UC Treatments / Results  Labs (all labs ordered are listed, but only abnormal results are displayed) Labs Reviewed - No data to display  EKG   Radiology No results  found.  Procedures Procedures (including  critical care time)  Medications Ordered in UC Medications - No data to display  Initial Impression / Assessment and Plan / UC Course  I have reviewed the triage vital signs and the nursing notes.  Pertinent labs & imaging results that were available during my care of the patient were reviewed by me and considered in my medical decision making (see chart for details).    Acute rhinitis discussed again the importance of continuously taking antihistamine therapy and use of Flonase  to prevent and treat acute rhinitis and recurrent congestion related to other allergens.  Explained at length there is no cure for common cold symptoms.  Explained there is no indication for antibiotics.  And at the end of the encounter showed me a on her hand.  Advised to use over-the-counter topical antibacterial ointment if she is having pain.  Hand nor wound appears to be infected.  Return as needed. Final Clinical Impressions(s) / UC Diagnoses   Final diagnoses:  Acute rhinitis  Abrasion of skin     Discharge Instructions      No infection present today.  Recommend Flonase  twice daily for the next 7 days then daily for preventative rhinitis seasonal allergy symptoms.  Also start levocetirizine take daily at bedtime to prevent recurrent nasal congestion and drainage related to seasonal allergies.  As discussed purchase over-the-counter Neosporin and Benadryl  topical anti-itch cream to applied a small abrasion on your hand.   ED Prescriptions     Medication Sig Dispense Auth. Provider   levocetirizine (XYZAL ) 5 MG tablet Take 1 tablet (5 mg total) by mouth every evening. 30 tablet Buena Carmine, NP   fluticasone  (FLONASE ) 50 MCG/ACT nasal spray Place 2 sprays into both nostrils daily. 15.8 mL Buena Carmine, NP      PDMP not reviewed this encounter.   Buena Carmine, NP 11/20/23 [phone]

## 2023-11-19 NOTE — ED Triage Notes (Signed)
 Pt c/o otalgia and nasal and chest congestion x 1 day. Pt says she thinks there is fluid in her ear.

## 2023-11-19 NOTE — Discharge Instructions (Signed)
 No infection present today.  Recommend Flonase twice daily for the next 7 days then daily for preventative rhinitis seasonal allergy symptoms.  Also start levocetirizine take daily at bedtime to prevent recurrent nasal congestion and drainage related to seasonal allergies.  As discussed purchase over-the-counter Neosporin and Benadryl  topical anti-itch cream to applied a small abrasion on your hand.

## 2023-12-06 ENCOUNTER — Ambulatory Visit
Admission: EM | Admit: 2023-12-06 | Discharge: 2023-12-06 | Disposition: A | Discharge status: Home / Self Care | Attending: Physician Assistant | Admitting: Physician Assistant

## 2023-12-06 ENCOUNTER — Ambulatory Visit

## 2023-12-06 DIAGNOSIS — S6992XA Unspecified injury of left wrist, hand and finger(s), initial encounter: Secondary | ICD-10-CM

## 2023-12-06 NOTE — ED Triage Notes (Signed)
"  My left index finger is swollen". "When trying to grab both dogs coming out the door I jammed my left index finger on the porch (metal) railing". DOI "about 5-6 wks ago". "The pain and swelling continues and becomes more and more".

## 2023-12-07 NOTE — ED Provider Notes (Signed)
 [facility]    CSN: [id number] Arrival date & time: 12/06/23  0841      History   Chief Complaint Chief Complaint  Patient presents with   Finger Injury    HPI Alicia Ortiz is a 43 y.o. female.   Patient here today for evaluation of swelling of her left index finger.  She states that about 5 to 6 weeks ago she went to grab both of her dogs when they were coming out the door and she jammed her left index finger on the metal railing of the porch.  She states she continues to have pain and swelling of the area.  She denies any numbness or tingling.  She has been wearing a splint finger to protect it.   The history is provided by the patient.    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   Sensorineural hearing loss (SNHL), bilateral 03/19/2016   NSVD (normal spontaneous vaginal delivery) 09/04/2013   Normal labor 09/03/2013   Severe Range Gestational hypertension w/o significant proteinuria in 3rd trimester 08/07/2013   Deaf 03/28/2013   Supervision of high risk pregnancy in third trimester 03/28/2013    Past Surgical History:  Procedure Laterality Date   BIOPSY BREAST     right breast    OB History     Gravida  3   Para  2   Term  2   Preterm  0   AB  1   Living  2      SAB  1   IAB  0   Ectopic  0   Multiple  0   Live Births  2            Home Medications    Prior to Admission medications   Medication Sig Start Date End Date Taking? Authorizing Provider  albuterol  (VENTOLIN  HFA) 108 (90 Base) MCG/ACT inhaler Inhale 1-2 puffs into the lungs every 6 (six) hours as needed for wheezing or shortness of breath. 09/28/23   Buena Carmine, NP  amoxicillin  (AMOXIL ) 875 MG tablet Take 1 tablet (875  mg total) by mouth 2 (two) times daily. 09/28/23   Buena Carmine, NP  benzonatate  (TESSALON ) 100 MG capsule Take 1 capsule (100 mg total) by mouth every 8 (eight) hours. 04/13/22   Vernestine Gondola, PA-C  benzonatate  (TESSALON ) 100 MG capsule Take 100 mg by mouth 3 (three) times daily as needed. 05/21/16   [provider]  ciprofloxacin-dexamethasone (CIPRODEX) OTIC suspension Place 4 drops into both ears 2 (two) times daily. 05/19/16   [provider]  fluticasone  (FLONASE ) 50 MCG/ACT nasal spray Place 2 sprays into both nostrils daily. 11/19/23   Buena Carmine, NP  levocetirizine (XYZAL ) 5 MG tablet Take 1 tablet (5 mg total) by mouth every evening. 11/19/23 02/17/24  Buena Carmine, NP  lisinopril  (ZESTRIL ) 10 MG tablet Take 1 tablet (10 mg total) by mouth daily. 06/04/21 09/28/23  Vernestine Gondola, PA-C  lisinopril  (ZESTRIL ) 5 MG tablet Take 1 tablet by mouth daily. 04/15/16   [provider]  meloxicam  (MOBIC ) 15 MG tablet Take 1 tablet (15 mg total) by mouth daily. 09/15/21   Persons, Norma Beckers, PA  predniSONE  (DELTASONE )  20 MG tablet Take 2 tablets (40 mg total) by mouth daily with breakfast. 09/28/23   Buena Carmine, NP  sertraline (ZOLOFT) 50 MG tablet Take 50 mg by mouth daily. 04/10/16   [provider]  triamcinolone  cream (KENALOG ) 0.1 % Apply 1 Application topically 2 (two) times daily as needed (itching rash). 09/28/23   Buena Carmine, NP    Family History Family History  Problem Relation Age of Onset   Stroke Mother    Hypertension Mother    Hyperlipidemia Mother    Cancer Daughter        leaukemia    Social History Social History   Tobacco Use   Smoking status: Former    Current packs/day: 0.00    Types: Cigarettes    Quit date: 08/24/2013    Years since quitting: 10.2   Smokeless tobacco: Never  Vaping Use   Vaping status: Never Used  Substance Use Topics   Alcohol use: Not Currently   Drug use: Not Currently      Allergies   Sulfa antibiotics and Sulfasalazine   Review of Systems Review of Systems  Constitutional:  Negative for chills and fever.  Eyes:  Negative for discharge and redness.  Gastrointestinal:  Negative for abdominal pain, nausea and vomiting.  Musculoskeletal:  Positive for arthralgias. Negative for joint swelling.  Neurological:  Negative for numbness.     Physical Exam Triage Vital Signs ED Triage Vitals  Encounter Vitals Group     BP 12/06/23 0905 (!) 152/88     Systolic BP Percentile --      Diastolic BP Percentile --      Pulse Rate 12/06/23 0905 72     Resp 12/06/23 0905 20     Temp 12/06/23 0905 98.1 F (36.7 C)     Temp Source 12/06/23 0905 Oral     SpO2 12/06/23 0905 98 %     Weight 12/06/23 0904 145 lb 15.1 oz (66.2 kg)     Height 12/06/23 0904 5\' 2"  (1.575 m)     Head Circumference --      Peak Flow --      Pain Score 12/06/23 0900 8     Pain Loc --      Pain Education --      Exclude from Growth Chart --    No data found.  Updated Vital Signs BP (!) 152/88 (BP Location: Left Arm)   Pulse 72   Temp 98.1 F (36.7 C) (Oral)   Resp 20   Ht 5\' 2"  (1.575 m)   Wt 145 lb 15.1 oz (66.2 kg)   LMP 11/23/2023 (Approximate)   SpO2 98%   BMI 26.69 kg/m   Visual Acuity Right Eye Distance:   Left Eye Distance:   Bilateral Distance:    Right Eye Near:   Left Eye Near:    Bilateral Near:     Physical Exam Vitals and nursing note reviewed.  Constitutional:      General: She is not in acute distress.    Appearance: Normal appearance. She is not ill-appearing.  HENT:     Head: Normocephalic and atraumatic.  Eyes:     Conjunctiva/sclera: Conjunctivae normal.  Cardiovascular:     Rate and Rhythm: Normal rate.  Pulmonary:     Effort: Pulmonary effort is normal.  Musculoskeletal:     Comments: Full ROM of left index finger without swelling or erythema appreciated. Mild TTP at left 2nd MCP  Skin:    Capillary  Refill: Normal cap refill to  left index finger Neurological:     Mental Status: She is alert.     Comments: Patient reports decreased sensation to left index finger distally due to wearing splint  Psychiatric:        Mood and Affect: Mood normal.        Behavior: Behavior normal.        Thought Content: Thought content normal.      UC Treatments / Results  Labs (all labs ordered are listed, but only abnormal results are displayed) Labs Reviewed - No data to display  EKG   Radiology DG Finger Index Left Result Date: 12/06/2023 CLINICAL DATA:  Pain, injury and swelling EXAM: LEFT INDEX FINGER 2+V COMPARISON:  None Available. FINDINGS: Marginal dorsal osteophytes of the dorsal ulnar aspect of the distal phalanx of the second digit without evidence of fractures. IMPRESSION: *No fracture or dislocation. *Marginal dorsal osteophytes of the distal phalanx of the second digit. Electronically Signed   By: Fredrich Jefferson M.D.   On: 12/06/2023 09:57    Procedures Procedures (including critical care time)  Medications Ordered in UC Medications - No data to display  Initial Impression / Assessment and Plan / UC Course  I have reviewed the triage vital signs and the nursing notes.  Pertinent labs & imaging results that were available during my care of the patient were reviewed by me and considered in my medical decision making (see chart for details).    No fracture on xray. Recommended continued conservative treatment and follow up with ortho if symptoms persist.   Final Clinical Impressions(s) / UC Diagnoses   Final diagnoses:  Injury of left index finger, initial encounter   Discharge Instructions   None    ED Prescriptions   None    PDMP not reviewed this encounter.   Vernestine Gondola, PA-C 12/07/23 1731
